# Patient Record
Sex: Male | Born: 1965 | Race: White | Hispanic: No | Smoking: Current every day smoker
Health system: Southern US, Community
[De-identification: ages and names within clinical notes are randomized; demographics above are authoritative.]

## PROBLEM LIST (undated history)

## (undated) ENCOUNTER — Emergency Department (HOSPITAL_COMMUNITY): Discharge status: Absent without leave | Payer: Medicare Other

## (undated) DIAGNOSIS — S069XAA Unspecified intracranial injury with loss of consciousness status unknown, initial encounter: Secondary | ICD-10-CM

## (undated) DIAGNOSIS — Z9889 Other specified postprocedural states: Secondary | ICD-10-CM

## (undated) DIAGNOSIS — I499 Cardiac arrhythmia, unspecified: Secondary | ICD-10-CM

## (undated) DIAGNOSIS — F99 Mental disorder, not otherwise specified: Secondary | ICD-10-CM

## (undated) DIAGNOSIS — S069X9A Unspecified intracranial injury with loss of consciousness of unspecified duration, initial encounter: Secondary | ICD-10-CM

## (undated) DIAGNOSIS — F329 Major depressive disorder, single episode, unspecified: Secondary | ICD-10-CM

## (undated) DIAGNOSIS — F32A Depression, unspecified: Secondary | ICD-10-CM

## (undated) HISTORY — PX: NECK SURGERY: SHX720

## (undated) HISTORY — PX: RADIOFREQUENCY ABLATION: SHX2290

---

## 2012-01-21 ENCOUNTER — Inpatient Hospital Stay (HOSPITAL_COMMUNITY): Payer: Medicare Other

## 2012-01-21 ENCOUNTER — Encounter (HOSPITAL_COMMUNITY): Payer: Self-pay | Admitting: *Deleted

## 2012-01-21 ENCOUNTER — Emergency Department (HOSPITAL_COMMUNITY): Payer: Medicare Other

## 2012-01-21 ENCOUNTER — Inpatient Hospital Stay (HOSPITAL_COMMUNITY)
Admission: EM | Admit: 2012-01-21 | Discharge: 2012-01-23 | DRG: 683 | Disposition: A | Discharge status: Home / Self Care | Payer: Medicare Other | Attending: Internal Medicine | Admitting: Internal Medicine

## 2012-01-21 DIAGNOSIS — M6282 Rhabdomyolysis: Secondary | ICD-10-CM

## 2012-01-21 DIAGNOSIS — N179 Acute kidney failure, unspecified: Principal | ICD-10-CM

## 2012-01-21 DIAGNOSIS — E860 Dehydration: Secondary | ICD-10-CM | POA: Diagnosis present

## 2012-01-21 DIAGNOSIS — Z860100 Personal history of colon polyps, unspecified: Secondary | ICD-10-CM

## 2012-01-21 DIAGNOSIS — Z79899 Other long term (current) drug therapy: Secondary | ICD-10-CM

## 2012-01-21 DIAGNOSIS — M549 Dorsalgia, unspecified: Secondary | ICD-10-CM | POA: Diagnosis present

## 2012-01-21 DIAGNOSIS — N19 Unspecified kidney failure: Secondary | ICD-10-CM

## 2012-01-21 DIAGNOSIS — R51 Headache: Secondary | ICD-10-CM | POA: Diagnosis present

## 2012-01-21 DIAGNOSIS — F319 Bipolar disorder, unspecified: Secondary | ICD-10-CM | POA: Diagnosis present

## 2012-01-21 DIAGNOSIS — M542 Cervicalgia: Secondary | ICD-10-CM

## 2012-01-21 DIAGNOSIS — F17200 Nicotine dependence, unspecified, uncomplicated: Secondary | ICD-10-CM | POA: Diagnosis present

## 2012-01-21 DIAGNOSIS — I959 Hypotension, unspecified: Secondary | ICD-10-CM

## 2012-01-21 DIAGNOSIS — Z9889 Other specified postprocedural states: Secondary | ICD-10-CM

## 2012-01-21 HISTORY — DX: Cardiac arrhythmia, unspecified: I49.9

## 2012-01-21 HISTORY — DX: Depression, unspecified: F32.A

## 2012-01-21 HISTORY — DX: Unspecified intracranial injury with loss of consciousness of unspecified duration, initial encounter: S06.9X9A

## 2012-01-21 HISTORY — DX: Major depressive disorder, single episode, unspecified: F32.9

## 2012-01-21 HISTORY — DX: Mental disorder, not otherwise specified: F99

## 2012-01-21 HISTORY — DX: Unspecified intracranial injury with loss of consciousness status unknown, initial encounter: S06.9XAA

## 2012-01-21 HISTORY — DX: Other specified postprocedural states: Z98.890

## 2012-01-21 LAB — COMPREHENSIVE METABOLIC PANEL
AST: 44 U/L — ABNORMAL HIGH (ref 0–37)
Alkaline Phosphatase: 85 U/L (ref 39–117)
CO2: 26 mEq/L (ref 19–32)
Chloride: 99 mEq/L (ref 96–112)
Creatinine, Ser: 3.07 mg/dL — ABNORMAL HIGH (ref 0.50–1.35)
GFR calc non Af Amer: 23 mL/min — ABNORMAL LOW (ref >90)
Potassium: 2.9 mEq/L — ABNORMAL LOW (ref 3.5–5.1)
Total Bilirubin: 0.3 mg/dL (ref 0.3–1.2)

## 2012-01-21 LAB — POCT I-STAT, CHEM 8
Calcium, Ion: 1.11 mmol/L — ABNORMAL LOW (ref 1.12–1.23)
Chloride: 102 mEq/L (ref 96–112)
Creatinine, Ser: 2.9 mg/dL — ABNORMAL HIGH (ref 0.50–1.35)
Glucose, Bld: 108 mg/dL — ABNORMAL HIGH (ref 70–99)
HCT: 43 % (ref 39.0–52.0)
Hemoglobin: 14.6 g/dL (ref 13.0–17.0)

## 2012-01-21 LAB — CBC WITH DIFFERENTIAL/PLATELET
Basophils Relative: 1 % (ref 0–1)
Eosinophils Absolute: 0.1 10*3/uL (ref 0.0–0.7)
MCH: 29.9 pg (ref 26.0–34.0)
MCHC: 34.3 g/dL (ref 30.0–36.0)
Neutrophils Relative %: 67 % (ref 43–77)
Platelets: 146 10*3/uL — ABNORMAL LOW (ref 150–400)
RDW: 13.5 % (ref 11.5–15.5)

## 2012-01-21 LAB — VALPROIC ACID LEVEL: Valproic Acid Lvl: 42.5 ug/mL — ABNORMAL LOW (ref 50.0–100.0)

## 2012-01-21 LAB — BASIC METABOLIC PANEL
CO2: 29 mEq/L (ref 19–32)
Calcium: 8.6 mg/dL (ref 8.4–10.5)
GFR calc non Af Amer: 48 mL/min — ABNORMAL LOW (ref >90)
Potassium: 3.8 mEq/L (ref 3.5–5.1)
Sodium: 141 mEq/L (ref 135–145)

## 2012-01-21 LAB — RAPID URINE DRUG SCREEN, HOSP PERFORMED
Barbiturates: NOT DETECTED
Cocaine: NOT DETECTED

## 2012-01-21 LAB — URINALYSIS, ROUTINE W REFLEX MICROSCOPIC
Leukocytes, UA: NEGATIVE
Protein, ur: NEGATIVE mg/dL
Urobilinogen, UA: 0.2 mg/dL (ref 0.0–1.0)

## 2012-01-21 LAB — TROPONIN I
Troponin I: <0.3 ng/mL (ref <0.30)
Troponin I: <0.3 ng/mL (ref <0.30)

## 2012-01-21 MED ORDER — DIVALPROEX SODIUM 250 MG PO DR TAB
250.0000 mg | DELAYED_RELEASE_TABLET | Freq: Every day | ORAL | Status: DC
  Administered 2012-01-22: 250 mg via ORAL
  Filled 2012-01-21: qty 1

## 2012-01-21 MED ORDER — DIVALPROEX SODIUM 250 MG PO DR TAB: 250.0000 mg | DELAYED_RELEASE_TABLET | Freq: Three times a day (TID) | ORAL | Status: DC

## 2012-01-21 MED ORDER — ALBUTEROL SULFATE (5 MG/ML) 0.5% IN NEBU: 2.5000 mg | INHALATION_SOLUTION | RESPIRATORY_TRACT | Status: DC | PRN

## 2012-01-21 MED ORDER — CLONAZEPAM 0.5 MG PO TABS
0.5000 mg | ORAL_TABLET | Freq: Three times a day (TID) | ORAL | Status: DC | PRN
  Administered 2012-01-21 – 2012-01-23 (×3): 0.5 mg via ORAL
  Filled 2012-01-21 (×3): qty 1

## 2012-01-21 MED ORDER — FENTANYL CITRATE 0.05 MG/ML IJ SOLN
50.0000 ug | Freq: Once | INTRAMUSCULAR | Status: AC
  Administered 2012-01-21: 50 ug via INTRAVENOUS
  Filled 2012-01-21: qty 2

## 2012-01-21 MED ORDER — ONDANSETRON HCL 4 MG PO TABS
4.0000 mg | ORAL_TABLET | Freq: Four times a day (QID) | ORAL | Status: DC | PRN
  Administered 2012-01-23: 4 mg via ORAL
  Filled 2012-01-21: qty 1

## 2012-01-21 MED ORDER — NICOTINE POLACRILEX 2 MG MT GUM
2.0000 mg | CHEWING_GUM | OROMUCOSAL | Status: DC | PRN
  Administered 2012-01-22 – 2012-01-23 (×2): 2 mg via ORAL
  Filled 2012-01-21 (×3): qty 1

## 2012-01-21 MED ORDER — ACETAMINOPHEN 325 MG PO TABS: 650.0000 mg | ORAL_TABLET | Freq: Four times a day (QID) | ORAL | Status: DC | PRN

## 2012-01-21 MED ORDER — POTASSIUM CHLORIDE CRYS ER 20 MEQ PO TBCR
20.0000 meq | EXTENDED_RELEASE_TABLET | Freq: Once | ORAL | Status: AC
  Administered 2012-01-21: 20 meq via ORAL
  Filled 2012-01-21: qty 1

## 2012-01-21 MED ORDER — ONDANSETRON HCL 4 MG/2ML IJ SOLN
4.0000 mg | Freq: Four times a day (QID) | INTRAMUSCULAR | Status: DC | PRN
  Administered 2012-01-21: 4 mg via INTRAVENOUS
  Filled 2012-01-21: qty 2

## 2012-01-21 MED ORDER — SODIUM CHLORIDE 0.9 % IV SOLN
INTRAVENOUS | Status: DC
  Administered 2012-01-21: 1000 mL via INTRAVENOUS
  Administered 2012-01-22: 10:00:00 via INTRAVENOUS
  Administered 2012-01-22: 125 mL via INTRAVENOUS
  Administered 2012-01-23: 75 mL/h via INTRAVENOUS

## 2012-01-21 MED ORDER — SODIUM CHLORIDE 0.9 % IV BOLUS (SEPSIS)
1000.0000 mL | Freq: Once | INTRAVENOUS | Status: AC
  Administered 2012-01-21: 1000 mL via INTRAVENOUS

## 2012-01-21 MED ORDER — MORPHINE SULFATE 2 MG/ML IJ SOLN
1.0000 mg | INTRAMUSCULAR | Status: DC | PRN
  Administered 2012-01-21 – 2012-01-22 (×5): 1 mg via INTRAVENOUS
  Filled 2012-01-21 (×5): qty 1

## 2012-01-21 MED ORDER — DIVALPROEX SODIUM 250 MG PO DR TAB
250.0000 mg | DELAYED_RELEASE_TABLET | Freq: Every day | ORAL | Status: DC
  Filled 2012-01-21: qty 1

## 2012-01-21 MED ORDER — OXYCODONE HCL 5 MG PO TABS
5.0000 mg | ORAL_TABLET | ORAL | Status: DC | PRN
  Administered 2012-01-21 – 2012-01-22 (×3): 5 mg via ORAL
  Filled 2012-01-21 (×3): qty 1

## 2012-01-21 MED ORDER — DIVALPROEX SODIUM 500 MG PO DR TAB
500.0000 mg | DELAYED_RELEASE_TABLET | Freq: Every day | ORAL | Status: DC
  Administered 2012-01-21 – 2012-01-22 (×2): 500 mg via ORAL
  Filled 2012-01-21 (×3): qty 1

## 2012-01-21 MED ORDER — ACETAMINOPHEN 650 MG RE SUPP: 650.0000 mg | Freq: Four times a day (QID) | RECTAL | Status: DC | PRN

## 2012-01-21 NOTE — H&P (Signed)
Dennis Fields is an 46 y.o. male.    PCP:  He is from out of town  Chief Complaint: Neck pain, back pain, dizzy  HPI: This is a 46 year old, Caucasian male, with a past medical history of cardiac ablation for unclear reasons. He also has a history of mental disorder, possibly bipolar. 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 About 2-3 hours after he started working he started having pain in the back of his head and the neck. The pain radiated down his back. He continued working and went home after working for 8 hours. He also had cramps in his fingers of both hands, as well as in his arms and his feet. This morning when he woke up he went to work, however, felt very dizzy, and he says, that he almost passed out 3 times. At that time, he decided to come in to the hospital for further evaluation. Currently, he has pain in the back of his head and neck, radiating down his lower back. Denies any fever or chills. Denies any sick contacts. He denies any chest pain or shortness of breath. Denies any visual disturbances. Denies any focal weakness. Denies any falls or injuries. Denies any new medications. He has never had these symptoms in the past. However, he has had lower back pain in the past. He, says, that he had been drinking fluids while working, and doesn't think that he got dehydrated.   Home Medications: Prior to Admission medications   Medication Sig Start Date End Date Taking? Authorizing Provider  amphetamine-dextroamphetamine (ADDERALL) 30 MG tablet Take 30 mg by mouth daily.   Yes Historical Provider, MD  aspirin EC 81 MG tablet Take 81 mg by mouth daily.   Yes Historical Provider, MD  clonazePAM (KLONOPIN) 1 MG tablet Take 1 mg by mouth 4 (four) times daily as needed. For anxiety   Yes Historical Provider, MD  divalproex (DEPAKOTE) 250 MG DR tablet Take 250-500 mg by mouth 3 (three) times daily.  Takes 250mg  in the am & 500mg  in at night   Yes Historical Provider, MD  metoprolol tartrate (LOPRESSOR) 25 MG tablet Take 25 mg by mouth 2 (two) times daily.   Yes Historical Provider, MD  QUEtiapine (SEROQUEL) 300 MG tablet Take 300 mg by mouth at bedtime.   Yes Historical Provider, MD  ranitidine (ZANTAC) 150 MG tablet Take 150 mg by mouth 2 (two) times daily.   Yes Historical Provider, MD    Allergies:  Allergies  Allergen Reactions  . Ketorolac Itching and Nausea And Vomiting  . Other     "Teramycin""  . Penicillins Nausea And Vomiting    Past Medical History: Past Medical History  Diagnosis Date  . Arrhythmia   . Mental disorder     Past Surgical History  Procedure Date  . Radiofrequency ablation   . Neck surgery     on salivary glands and lymph nodes for unknown reason    Social History:  reports that he has been smoking Cigarettes.  He has a 42 pack-year smoking history. He does not have any smokeless tobacco history on file. He reports that he does not drink alcohol or use illicit drugs.  Family History: He was not very specific about his family medical history, but did admit to heart disease, lung disease, and diabetes in the family.  Review of Systems - History obtained from  the patient General ROS: positive for  - fatigue Psychological ROS: negative Ophthalmic ROS: negative ENT ROS: negative Allergy and Immunology ROS: negative Hematological and Lymphatic ROS: negative Endocrine ROS: negative Respiratory ROS: no cough, shortness of breath, or wheezing Cardiovascular ROS: no chest pain or dyspnea on exertion Gastrointestinal ROS: no abdominal pain, change in bowel habits, or black or bloody stools Genito-Urinary ROS: no dysuria, trouble voiding, or hematuria Musculoskeletal ROS: as in hpi Neurological ROS: as in hpi Dermatological ROS: negative  Physical Examination Blood pressure 98/67, pulse 78, temperature 98.6 F (37 C), temperature source Oral, resp.  rate 20, SpO2 97.00%.  General appearance: alert, cooperative, appears stated age, no distress and moderately obese Head: Normocephalic, without obvious abnormality, atraumatic Eyes: conjunctivae/corneas clear. PERRL, EOM's intact.  Throat: lips, mucosa, and tongue normal; teeth and gums normal Neck: no adenopathy, no carotid bruit, no JVD, trachea midline and thyroid not enlarged, symmetric. Did have limited ROM. There was pain with movement of neck.  Back: symmetric, no curvature. ROM normal. No CVA tenderness. No tenderness over spinous processes Resp: clear to auscultation bilaterally Cardio: regular rate and rhythm, S1, S2 normal, no murmur, click, rub or gallop GI: soft, non-tender; bowel sounds normal; no masses,  no organomegaly Extremities: extremities normal, atraumatic, no cyanosis or edema. SLR was positive bilaterally. But patient had good strength Pulses: 2+ and symmetric Skin: Skin color, texture, turgor normal. No rashes or lesions Lymph nodes: Cervical, supraclavicular, and axillary nodes normal. Neurologic: He is alert and oriented x3. Cranial nerves intact. Motor strength is equal bilaterally. Reflexes are equal, bilaterally. No sensory deficits. Gait was not assessed at this time.  Laboratory Data: Results for orders placed during the hospital encounter of 01/21/12 (from the past 48 hour(s))  CBC WITH DIFFERENTIAL     Status: Abnormal   Collection Time   01/21/12 12:48 PM      Component Value Range Comment   WBC 6.9  4.0 - 10.5 K/uL    RBC 4.79  4.22 - 5.81 MIL/uL    Hemoglobin 14.3  13.0 - 17.0 g/dL    HCT 16.1  09.6 - 04.5 %    MCV 87.1  78.0 - 100.0 fL    MCH 29.9  26.0 - 34.0 pg    MCHC 34.3  30.0 - 36.0 g/dL    RDW 40.9  81.1 - 91.4 %    Platelets 146 (*) 150 - 400 K/uL    Neutrophils Relative 67  43 - 77 %    Neutro Abs 4.6  1.7 - 7.7 K/uL    Lymphocytes Relative 20  12 - 46 %    Lymphs Abs 1.4  0.7 - 4.0 K/uL    Monocytes Relative 10  3 - 12 %     Monocytes Absolute 0.7  0.1 - 1.0 K/uL    Eosinophils Relative 2  0 - 5 %    Eosinophils Absolute 0.1  0.0 - 0.7 K/uL    Basophils Relative 1  0 - 1 %    Basophils Absolute 0.0  0.0 - 0.1 K/uL   TROPONIN I     Status: Normal   Collection Time   01/21/12 12:48 PM      Component Value Range Comment   Troponin I <0.30  <0.30 ng/mL   LACTIC ACID, PLASMA     Status: Abnormal   Collection Time   01/21/12 12:49 PM      Component Value Range Comment   Lactic Acid, Venous 2.4 (*) 0.5 -  2.2 mmol/L   POCT I-STAT, CHEM 8     Status: Abnormal   Collection Time   01/21/12  1:02 PM      Component Value Range Comment   Sodium 143  135 - 145 mEq/L    Potassium 3.2 (*) 3.5 - 5.1 mEq/L    Chloride 102  96 - 112 mEq/L    BUN 33 (*) 6 - 23 mg/dL    Creatinine, Ser 1.61 (*) 0.50 - 1.35 mg/dL    Glucose, Bld 096 (*) 70 - 99 mg/dL    Calcium, Ion 0.45 (*) 1.12 - 1.23 mmol/L    TCO2 25  0 - 100 mmol/L    Hemoglobin 14.6  13.0 - 17.0 g/dL    HCT 40.9  81.1 - 91.4 %   CK TOTAL AND CKMB     Status: Abnormal   Collection Time   01/21/12  1:29 PM      Component Value Range Comment   Total CK 1158 (*) 7 - 232 U/L    CK, MB 32.9 (*) 0.3 - 4.0 ng/mL    Relative Index 2.8 (*) 0.0 - 2.5     Radiology Reports: Dg Chest Port 1 View  01/21/2012  *RADIOLOGY REPORT*  Clinical Data: Dizziness and chest pressure.  Prior cardiac catheterization.  PORTABLE CHEST - 1 VIEW  Comparison: None.  Findings: Mild apical lordotic patient positioning. Midline trachea.  Normal heart size and mediastinal contours. No pleural effusion or pneumothorax.  No congestive failure.  Clear lungs.  IMPRESSION: No acute cardiopulmonary disease.   Original Report Authenticated By: Consuello Bossier, M.D.     Electrocardiogram: Normal sinus rhythm at 88 beats per minute. Normal axis. Intervals appear to be in the normal range. No significant Q waves. No concerning ST or T-wave changes noted.  Assessment/Plan  Principal Problem:  *ARF (acute  renal failure) Active Problems:  Neck pain  Back pain  Hypotension  History of prior ablation treatment  History of colon polyps   #1 acute renal failure: This is most likely prerenal azotemia secondary to dehydration. We do not have any baseline labs on this patient. He'll be given IV fluids. His renal function will be monitored closely. If his renal function does not improve, renal ultrasound will be considered. A UA will be checked. Calcium level will be checked.  #2 hypotension: Possibly from hypovolemia. His blood pressure did improve with IV fluids. We'll continue to provide him normal saline. We will monitor him closely tonight in the step down unit.  #3 neck pain, headache, and back pain: Etiology remains unclear. There are no neurological deficits. There is no history of injury. However, we will proceed with a CT head and neck at this time. He denies any chest pain, and his EKG was nonacute. I believe his muscular pain may be due to mild rhabdomyolysis as his CK levels are elevated. We will also get the x-ray of his lumbar spine.  #4 rhabdomyolysis: We will trend CK levels of daily. We'll give him IV fluids.  #5 tobacco abuse: Counseling was provided. He requested Nicotine gum, which will be provided to him.  #6 history of mental disorder: We will check a Depakote level. Will hold the Seroquel for tonight due to hypotension. Hold his Adderall as well.  DVT, prophylaxis with SCDs.  He is a full code.  Further management decisions will depend on results of further testing and patient's response to treatment  [facility]  [facility] Pager [phone]  01/21/2012,  3:51 PM

## 2012-01-21 NOTE — ED Notes (Signed)
EDP aware of CKMB

## 2012-01-21 NOTE — ED Notes (Signed)
Pt knows that urine is needed 

## 2012-01-21 NOTE — ED Notes (Signed)
Pt reports started having cervical spine pain and tenderness while working. States when he got home last night he started having cramping to his arms and legs. Stated went to work this am and had several dizzy spells. Denies chest pain or sob. On exam pt moves all extremities well but states that legs feel heavy and hurt to move. Pt reports pain when he moves his head side to side right side worse then left. Reports tenderness with palpation down entire cspine, denies any injury yesterday while at work. CMS intact. ROM intact.

## 2012-01-21 NOTE — ED Notes (Signed)
PT states that he started getting neck pain yesterday while working and patient states pain from mid back down.  Pt reports cramps in hands, fingers and feet.  PT reports dizziness and blood pressure is in the 70s systolic at triage.  Pt has seen cardiologist for ablations

## 2012-01-21 NOTE — ED Provider Notes (Signed)
History     CSN: [id number]  Arrival date & time 01/21/12  1200   First MD Initiated Contact with Patient 01/21/12 1223      Chief Complaint  Patient presents with  . Neck Pain  . Dizziness    (Consider location/radiation/quality/duration/timing/severity/associated sxs/prior treatment) Patient is a 46 y.o. male presenting with neck pain. The history is provided by the patient.  Neck Pain  This is a new problem. Pertinent negatives include no chest pain, no numbness, no headaches and no weakness.   patient states that he started getting neck pain while working yesterday. 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 He states the pain has now moved down his back. He states he has had cramping in his hands fingers and feet. He's had lightheadedness and felt like he couldn't pass out. Initial blood pressure was in the 70s. He is recently seen a cardiologist for ablations. He is on metoprolol. No fevers. No trauma. No abdominal pain. No previous episodes like this.  Past Medical History  Diagnosis Date  . Arrhythmia     Past Surgical History  Procedure Date  . Radiofrequency ablation   . Cervical fusion     No family history on file.  History  Substance Use Topics  . Smoking status: Current Everyday Smoker  . Smokeless tobacco: Not on file  . Alcohol Use: No      Review of Systems  Constitutional: Negative for activity change and appetite change.  HENT: Positive for neck pain. Negative for neck stiffness.   Eyes: Negative for pain.  Respiratory: Negative for chest tightness and shortness of breath.   Cardiovascular: Negative for chest pain and leg swelling.  Gastrointestinal: Negative for nausea, vomiting, abdominal pain and diarrhea.  Genitourinary: Negative for flank pain.  Musculoskeletal: Negative for back pain.  Skin: Negative for rash.  Neurological: Positive for light-headedness. Negative for weakness, numbness and headaches.  Psychiatric/Behavioral: Negative for behavioral  problems.    Allergies  Ketorolac; Other; and Penicillins  Home Medications   Current Outpatient Rx  Name Route Sig Dispense Refill  . AMPHETAMINE-DEXTROAMPHETAMINE 30 MG PO TABS Oral Take 30 mg by mouth daily.    . ASPIRIN EC 81 MG PO TBEC Oral Take 81 mg by mouth daily.    Marland Kitchen CLONAZEPAM 1 MG PO TABS Oral Take 1 mg by mouth 4 (four) times daily as needed. For anxiety    . DIVALPROEX SODIUM 250 MG PO TBEC Oral Take 250-500 mg by mouth 3 (three) times daily. Takes 250mg  in the am & 500mg  in at night    . METOPROLOL TARTRATE 25 MG PO TABS Oral Take 25 mg by mouth 2 (two) times daily.    . QUETIAPINE FUMARATE 300 MG PO TABS Oral Take 300 mg by mouth at bedtime.    Marland Kitchen RANITIDINE HCL 150 MG PO TABS Oral Take 150 mg by mouth 2 (two) times daily.      BP 98/67  Pulse 78  Temp 98.6 F (37 C) (Oral)  Resp 20  SpO2 97%  Physical Exam  Nursing note and vitals reviewed. Constitutional: He is oriented to person, place, and time. He appears well-developed and well-nourished.  HENT:  Head: Normocephalic and atraumatic.  Eyes: EOM are normal. Pupils are equal, round, and reactive to light.  Neck: Normal range of motion. Neck supple.       Tenderness to bilateral paraspinal area.  Cardiovascular: Normal rate, regular rhythm and normal heart sounds.   No murmur heard. Pulmonary/Chest: Effort normal and  breath sounds normal.  Abdominal: Soft. Bowel sounds are normal. He exhibits no distension and no mass. There is no tenderness. There is no rebound and no guarding.  Musculoskeletal: Normal range of motion. He exhibits no edema.  Neurological: He is alert and oriented to person, place, and time. No cranial nerve deficit.  Skin: Skin is warm and dry.  Psychiatric: He has a normal mood and affect.    ED Course  Procedures (including critical care time)  Labs Reviewed  CBC WITH DIFFERENTIAL - Abnormal; Notable for the following:    Platelets 146 (*)     All other components within normal  limits  LACTIC ACID, PLASMA - Abnormal; Notable for the following:    Lactic Acid, Venous 2.4 (*)     All other components within normal limits  POCT I-STAT, CHEM 8 - Abnormal; Notable for the following:    Potassium 3.2 (*)     BUN 33 (*)     Creatinine, Ser 2.90 (*)     Glucose, Bld 108 (*)     Calcium, Ion 1.11 (*)     All other components within normal limits  CK TOTAL AND CKMB - Abnormal; Notable for the following:    Total CK 1158 (*)     CK, MB 32.9 (*)     Relative Index 2.8 (*)     All other components within normal limits  TROPONIN I  URINALYSIS, ROUTINE W REFLEX MICROSCOPIC   Dg Chest Port 1 View  01/21/2012  *RADIOLOGY REPORT*  Clinical Data: Dizziness and chest pressure.  Prior cardiac catheterization.  PORTABLE CHEST - 1 VIEW  Comparison: None.  Findings: Mild apical lordotic patient positioning. Midline trachea.  Normal heart size and mediastinal contours. No pleural effusion or pneumothorax.  No congestive failure.  Clear lungs.  IMPRESSION: No acute cardiopulmonary disease.   Original Report Authenticated By: Consuello Bossier, M.D.      No diagnosis found.   Date: 01/21/2012  Rate:  Date: 01/21/2012  Rate: 88  Rhythm: normal sinus rhythm  QRS Axis: normal  Intervals: normal  ST/T Wave abnormalities: normal  Conduction Disutrbances:none  Narrative Interpretation:   Old EKG Reviewed: none available   CRITICAL CARE Performed by: Billee Cashing   Total critical care time: 30 Critical care time was exclusive of separately billable procedures and treating other patients.  Critical care was necessary to treat or prevent imminent or life-threatening deterioration.  Critical care was time spent personally by me on the following activities: development of treatment plan with patient and/or surrogate as well as nursing, discussions with consultants, evaluation of patient's response to treatment, examination of patient, obtaining history from patient or  surrogate, ordering and performing treatments and interventions, ordering and review of laboratory studies, ordering and review of radiographic studies, pulse oximetry and re-evaluation of patient's condition.   MDM  Patient with you neck pain and dizziness. Hypertension is improved somewhat with fluids. Lab work shows a new renal failure. Total CK is mildly elevated. He is mildly elevated lactate. EKG is reassuring. X-ray chest showed no acute cardiopulmonary disease. CT chest with done to evaluate for possibility of dissection. CT angiogram done due 2 renal failure. Patient be admitted to medicine.        Juliet Rude. Rubin Payor, MD 01/21/12 1550

## 2012-01-21 NOTE — ED Notes (Signed)
Admitting paged regarding Potassium

## 2012-01-21 NOTE — ED Notes (Signed)
IV attempted x 2, IV infiltrating with saline will have another nurse attempt

## 2012-01-22 LAB — COMPREHENSIVE METABOLIC PANEL
ALT: 39 U/L (ref 0–53)
AST: 35 U/L (ref 0–37)
Albumin: 3.2 g/dL — ABNORMAL LOW (ref 3.5–5.2)
Alkaline Phosphatase: 74 U/L (ref 39–117)
Calcium: 8.3 mg/dL — ABNORMAL LOW (ref 8.4–10.5)
Glucose, Bld: 116 mg/dL — ABNORMAL HIGH (ref 70–99)
Potassium: 3.9 mEq/L (ref 3.5–5.1)
Sodium: 140 mEq/L (ref 135–145)
Total Protein: 6.1 g/dL (ref 6.0–8.3)

## 2012-01-22 LAB — CBC
HCT: 38.3 % — ABNORMAL LOW (ref 39.0–52.0)
Hemoglobin: 12.7 g/dL — ABNORMAL LOW (ref 13.0–17.0)
MCH: 29.3 pg (ref 26.0–34.0)
MCHC: 33.2 g/dL (ref 30.0–36.0)
MCV: 88.5 fL (ref 78.0–100.0)
RDW: 13.4 % (ref 11.5–15.5)

## 2012-01-22 LAB — PROTIME-INR: INR: 0.97 (ref 0.00–1.49)

## 2012-01-22 LAB — TROPONIN I
Troponin I: <0.3 ng/mL (ref <0.30)
Troponin I: <0.3 ng/mL (ref <0.30)

## 2012-01-22 LAB — TSH: TSH: 0.666 u[IU]/mL (ref 0.350–4.500)

## 2012-01-22 LAB — CK TOTAL AND CKMB (NOT AT ARMC): Relative Index: 2.5 (ref 0.0–2.5)

## 2012-01-22 LAB — CORTISOL-AM, BLOOD: Cortisol - AM: 2.4 ug/dL — ABNORMAL LOW (ref 4.3–22.4)

## 2012-01-22 MED ORDER — COSYNTROPIN 0.25 MG IJ SOLR
0.2500 mg | Freq: Once | INTRAMUSCULAR | Status: AC
  Administered 2012-01-22: 0.25 mg via INTRAVENOUS
  Filled 2012-01-22: qty 0.25

## 2012-01-22 MED ORDER — ENOXAPARIN SODIUM 40 MG/0.4ML ~~LOC~~ SOLN
40.0000 mg | SUBCUTANEOUS | Status: DC
  Administered 2012-01-22: 40 mg via SUBCUTANEOUS
  Filled 2012-01-22 (×2): qty 0.4

## 2012-01-22 MED ORDER — FENTANYL CITRATE 0.05 MG/ML IJ SOLN
25.0000 ug | INTRAMUSCULAR | Status: DC | PRN
  Administered 2012-01-22: 50 ug via INTRAVENOUS
  Administered 2012-01-22: 25 ug via INTRAVENOUS
  Administered 2012-01-22 – 2012-01-23 (×5): 50 ug via INTRAVENOUS
  Filled 2012-01-22 (×6): qty 2

## 2012-01-22 MED ORDER — QUETIAPINE FUMARATE 300 MG PO TABS
300.0000 mg | ORAL_TABLET | Freq: Every evening | ORAL | Status: DC | PRN
  Filled 2012-01-22: qty 1

## 2012-01-22 MED ORDER — SENNOSIDES-DOCUSATE SODIUM 8.6-50 MG PO TABS
1.0000 | ORAL_TABLET | Freq: Every day | ORAL | Status: DC
  Administered 2012-01-22: 1 via ORAL
  Filled 2012-01-22: qty 1

## 2012-01-22 MED ORDER — DIVALPROEX SODIUM 500 MG PO DR TAB
500.0000 mg | DELAYED_RELEASE_TABLET | Freq: Every day | ORAL | Status: DC
  Filled 2012-01-22: qty 1

## 2012-01-22 MED ORDER — DIVALPROEX SODIUM 250 MG PO DR TAB
250.0000 mg | DELAYED_RELEASE_TABLET | Freq: Every day | ORAL | Status: DC
  Administered 2012-01-23: 250 mg via ORAL
  Filled 2012-01-22 (×2): qty 1

## 2012-01-22 MED ORDER — MORPHINE SULFATE 2 MG/ML IJ SOLN: 2.0000 mg | INTRAMUSCULAR | Status: DC | PRN

## 2012-01-22 MED ORDER — COSYNTROPIN 0.25 MG IJ SOLR
0.2500 mg | Freq: Once | INTRAMUSCULAR | Status: AC
  Administered 2012-01-23: 0.25 mg via INTRAVENOUS
  Filled 2012-01-22 (×2): qty 0.25

## 2012-01-22 MED ORDER — OXYCODONE HCL 5 MG PO TABS
5.0000 mg | ORAL_TABLET | Freq: Four times a day (QID) | ORAL | Status: DC
  Administered 2012-01-22 – 2012-01-23 (×4): 5 mg via ORAL
  Filled 2012-01-22 (×4): qty 1

## 2012-01-22 NOTE — Progress Notes (Signed)
[facility] PROGRESS NOTE  Dennis Fields ZOX:[id number] DOB: 04-Jun-1965 DOA: 01/21/2012 PCP: Pcp Not In System  Assessment/Plan: Principal Problem:  *ARF Likely prerenal - now improved with hydration.   Active Problems:  Neck pain/Back pain Imaging is negative- will treat as musculoskeletal pain - due to recent decline renal function, will hold off on starting NSAIDS for today- attempt pain control with Percocet Q6hr for now with PRN Fentanyl for breakthrough.    Hypotension Improved with hydration. Orthostatics obtained this AM are negative.    Rhabdomyolysis Improving with hydration, will cont IVF but decrease rate for now to 100 cc/hr.    History of prior ablation treatment   History of colon polyps    Code Status: Full Code Family Communication: pt alert and plan discussed- no family at bedside Disposition Plan: transfer to med/surg DVT prophylaxis: lovenox   Brief narrative: This is a 46 year old, Caucasian male, with a past medical history of cardiac ablation for unclear reasons. He also has a history of mental disorder, possibly bipolar. 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 About 2-3 hours after he started working he started having pain in the back of his head and the neck. The pain radiated down his back. He continued working for the next 8 hrs. He began to note cramps in his fingers of both hands, arms and his feet. This morning when he woke up he went to work, however, felt very dizzy, and he says, that he almost passed out 3 times. At that time, he decided to come in to the hospital for further evaluation. Currently, he has pain in the back of his head and neck, radiating down his lower back. Denies any fever or chills. Denies any sick contacts. He denies any chest pain or shortness of breath. Denies any visual disturbances. Denies any focal weakness. Denies any falls or  injuries. Denies any new medications. He has never had these symptoms in the past. However, he has had lower back pain in the past. He, says, that he had been drinking fluids while working, and doesn't think that he has become dehydrated.   Consultants:  none  Procedures:  none  Antibiotics:  none  HPI/Subjective: Pt c/o pain in back of neck and down the back which is currently severe and not controlled with Morphine- requesting Fentanyl. No other complaints. No longer having any cramping. No feeling of being lightheaded upon ambulation.   Objective: Filed Vitals:   01/22/12 1105 01/22/12 1115 01/22/12 1118 01/22/12 1121  BP: 117/62 97/61 98/69  103/57  Pulse: 85 73 81 81  Temp: 97.9 F (36.6 C)     TempSrc: Oral     Resp: 16 11 13 13   Height:      Weight:      SpO2: 91% 93% 90% 93%    Intake/Output Summary (Last 24 hours) at 01/22/12 1443 Last data filed at 01/22/12 1200  Gross per 24 hour  Intake 2990.83 ml  Output    700 ml  Net 2290.83 ml    Exam:   General:  No acute distress  Cardiovascular: RRR, nop murmurs  Respiratory: CTA b/l  Abdomen: soft, NT , ND, BS+  Ext:no c/c/e  Musculoskeletal: tender in base of neck bilaterally- tender in paraspinal areas through- out entire back.   Data Reviewed: Basic Metabolic Panel:  Lab 01/22/12 8119 01/21/12 2130 01/21/12 1536 01/21/12 1302  NA  140 141 140 143  K 3.9 3.8 2.9* 3.2*  CL 104 104 99 102  CO2 29 29 26  --  GLUCOSE 116* 122* 92 108*  BUN 19 24* 29* 33*  CREATININE 1.26 1.68* 3.07* 2.90*  CALCIUM 8.3* 8.6 9.5 --  MG -- -- -- --  PHOS -- -- -- --   Liver Function Tests:  Lab 01/22/12 0530 01/21/12 1536  AST 35 44*  ALT 39 47  ALKPHOS 74 85  BILITOT 0.2* 0.3  PROT 6.1 6.8  ALBUMIN 3.2* 3.6   No results found for this basename: LIPASE:5,AMYLASE:5 in the last 168 hours No results found for this basename: AMMONIA:5 in the last 168 hours CBC:  Lab 01/22/12 0530 01/21/12 1302 01/21/12 1248    WBC 4.8 -- 6.9  NEUTROABS -- -- 4.6  HGB 12.7* 14.6 14.3  HCT 38.3* 43.0 41.7  MCV 88.5 -- 87.1  PLT 131* -- 146*   Cardiac Enzymes:  Lab 01/22/12 0530 01/21/12 2324 01/21/12 1750 01/21/12 1329 01/21/12 1248  CKTOTAL 629* -- -- 1158* --  CKMB 15.9* -- -- 32.9* --  CKMBINDEX -- -- -- -- --  TROPONINI <0.30 <0.30 <0.30 -- <0.30   BNP (last 3 results) No results found for this basename: PROBNP:3 in the last 8760 hours CBG: No results found for this basename: GLUCAP:5 in the last 168 hours  No results found for this or any previous visit (from the past 240 hour(s)).   Studies: Dg Lumbar Spine 2-3 Views  01/21/2012  *RADIOLOGY REPORT*  Clinical Data: Low back pain.  LUMBAR SPINE - 2-3 VIEW  Comparison: None.  Findings: There are six non-rib-bearing lumbar vertebrae. Vertebral body alignment and heights are normal.  There is mild spondylosis present.  There is mild disc space narrowing at the L2- 3, L3-4 and L4-5 levels.  There is no compression fracture or subluxation.  There is mild facet arthropathy of the lower lumbar spine.  IMPRESSION: Mild spondylosis with degenerative disc disease as described.   Original Report Authenticated By: Elba Barman, M.D.    Ct Head Wo Contrast  01/21/2012  *RADIOLOGY REPORT*  Clinical Data:  Neck pain, headache, dizziness, syncopal episode  CT HEAD WITHOUT CONTRAST CT CERVICAL SPINE WITHOUT CONTRAST  Technique:  Multidetector CT imaging of the head and cervical spine was performed following the standard protocol without intravenous contrast.  Multiplanar CT image reconstructions of the cervical spine were also generated.  Comparison:  None  CT HEAD  Findings: Normal ventricular morphology. No midline shift or mass effect. Normal appearance of brain parenchyma. No intracranial hemorrhage, mass lesion, or acute infarction. Visualized paranasal sinuses and mastoid air cells clear. Bones unremarkable.  IMPRESSION: No acute intracranial abnormalities.  CT  CERVICAL SPINE  Findings: Disc space narrowing with endplate spur formation C5-C6, C6-C7. Visualized skull base intact. Vertebral body and disc space heights otherwise maintained without fracture or subluxation. Minimal scattered facet degenerative changes. Prevertebral soft tissues normal thickness. Tips of lung apices clear.  IMPRESSION: Degenerative disc disease changes C5-C6 and C6-C7. No acute cervical spine abnormalities.   Original Report Authenticated By: Lollie Marrow, M.D.    Ct Chest Wo Contrast  01/21/2012  *RADIOLOGY REPORT*  Clinical Data: Neck pain, dizziness, syncope, tingling in hands and feet  CT CHEST WITHOUT CONTRAST  Technique:  Multidetector CT imaging of the chest was performed following the standard protocol without IV contrast. Sagittal and coronal MPR images reconstructed from axial data set.  Comparison: Chest radiograph 01/21/2012  Findings:  Aorta normal caliber. No enlarged thoracic lymph nodes. Visualized portion of upper abdomen unremarkable. Beam hardening artifacts at the upper chest secondary to the patient's shoulders. Dependent atelectasis in posterior aspects of both lungs predominately lower lobes. No definite pulmonary infiltrate, pleural effusion, or pneumothorax. No pulmonary mass identified. Question minimal atelectasis or scarring at lingula and medial right middle lobe. No acute osseous findings.  IMPRESSION: Dependent atelectasis. No additional intrathoracic abnormalities.   Original Report Authenticated By: Lollie Marrow, M.D.    Ct Cervical Spine Wo Contrast  01/21/2012  *RADIOLOGY REPORT*  Clinical Data:  Neck pain, headache, dizziness, syncopal episode  CT HEAD WITHOUT CONTRAST CT CERVICAL SPINE WITHOUT CONTRAST  Technique:  Multidetector CT imaging of the head and cervical spine was performed following the standard protocol without intravenous contrast.  Multiplanar CT image reconstructions of the cervical spine were also generated.  Comparison:  None  CT HEAD   Findings: Normal ventricular morphology. No midline shift or mass effect. Normal appearance of brain parenchyma. No intracranial hemorrhage, mass lesion, or acute infarction. Visualized paranasal sinuses and mastoid air cells clear. Bones unremarkable.  IMPRESSION: No acute intracranial abnormalities.  CT CERVICAL SPINE  Findings: Disc space narrowing with endplate spur formation C5-C6, C6-C7. Visualized skull base intact. Vertebral body and disc space heights otherwise maintained without fracture or subluxation. Minimal scattered facet degenerative changes. Prevertebral soft tissues normal thickness. Tips of lung apices clear.  IMPRESSION: Degenerative disc disease changes C5-C6 and C6-C7. No acute cervical spine abnormalities.   Original Report Authenticated By: Lollie Marrow, M.D.    Dg Chest Port 1 View  01/21/2012  *RADIOLOGY REPORT*  Clinical Data: Dizziness and chest pressure.  Prior cardiac catheterization.  PORTABLE CHEST - 1 VIEW  Comparison: None.  Findings: Mild apical lordotic patient positioning. Midline trachea.  Normal heart size and mediastinal contours. No pleural effusion or pneumothorax.  No congestive failure.  Clear lungs.  IMPRESSION: No acute cardiopulmonary disease.   Original Report Authenticated By: Consuello Bossier, M.D.     Scheduled Meds:   . cosyntropin  0.25 mg Intravenous Once  . divalproex  250 mg Oral Daily  . divalproex  500 mg Oral QHS  . divalproex  500 mg Oral QHS  . fentaNYL  50 mcg Intravenous Once  . oxyCODONE  5 mg Oral Q6H  . potassium chloride  20 mEq Oral Once  . senna-docusate  1 tablet Oral QHS  . sodium chloride  1,000 mL Intravenous Once  . DISCONTD: divalproex  250 mg Oral Daily  . DISCONTD: divalproex  250 mg Oral Daily  . DISCONTD: divalproex  250-500 mg Oral TID   Continuous Infusions:   . sodium chloride 125 mL/hr at 01/22/12 4782    ________________________________________________________________________  Time spent: 30  min    [facility]  [facility] Pager [phone] If 8PM-8AM, please contact night-coverage at [email or web address], password [facility] 01/22/2012, 2:43 PM  LOS: 1 day

## 2012-01-23 DIAGNOSIS — M549 Dorsalgia, unspecified: Secondary | ICD-10-CM

## 2012-01-23 LAB — CBC
HCT: 38 % — ABNORMAL LOW (ref 39.0–52.0)
Hemoglobin: 12.5 g/dL — ABNORMAL LOW (ref 13.0–17.0)
MCHC: 32.9 g/dL (ref 30.0–36.0)
WBC: 4.9 10*3/uL (ref 4.0–10.5)

## 2012-01-23 LAB — CK TOTAL AND CKMB (NOT AT ARMC)
CK, MB: 5.8 ng/mL — ABNORMAL HIGH (ref 0.3–4.0)
Relative Index: 1.7 (ref 0.0–2.5)

## 2012-01-23 MED ORDER — HYDROCODONE-ACETAMINOPHEN 10-325 MG PO TABS: 1.0000 | ORAL_TABLET | Freq: Four times a day (QID) | ORAL | Status: AC | PRN

## 2012-01-23 MED ORDER — AMPHETAMINE-DEXTROAMPHETAMINE 10 MG PO TABS: 30.0000 mg | ORAL_TABLET | Freq: Every day | ORAL | Status: DC

## 2012-01-23 NOTE — Progress Notes (Signed)
@  0015 Transferred pt uneventfully to 5N14.

## 2012-01-23 NOTE — Discharge Summary (Addendum)
Physician Discharge Summary  Patient ID: Dennis Fields MRN: [id number] DOB/AGE: 11-10-1965 46 y.o.  Admit date: 01/21/2012 Discharge date: 01/23/2012  PCP: Dr. Lynnae Prude in [place]  DISCHARGE DIAGNOSES:  Principal Problem:  *ARF (acute renal failure) Active Problems:  Neck pain  Back pain  Hypotension  History of prior ablation treatment  History of colon polyps  Rhabdomyolysis   RECOMMENDATIONS TO PCP: 1. Follow up on results of Cosyntropin Stimulation Test  DISCHARGE CONDITION: fair  INITIAL HISTORY: This is a 46 year old, Caucasian male, with a past medical history of cardiac ablation for unclear reasons. He also has a history of mental disorder, possibly bipolar. 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 About 2-3 hours after he started working he started having pain in the back of his head and the neck. The pain radiated down his back. He continued working and went home after working for 8 hours. He also had cramps in his fingers of both hands, as well as in his arms and his feet. The morning of admission when he woke up he went to work, however, felt very dizzy, and he says, that he almost passed out 3 times. At that time, he decided to come in to the hospital for further evaluation. He had pain in the back of his head and neck, radiating down his lower back. Denied any fever or chills. Denied any sick contacts. He denied any chest pain or shortness of breath. Denied any visual disturbances. Denied any focal weakness. Denied any falls or injuries. Denied any new medications. He has never had these symptoms in the past. However, he has had lower back pain in the past. He, says, that he had been drinking fluids while working, and doesn't think that he got dehydrated.   HOSPITAL COURSE:   #1 Acute renal failure: This was most likely prerenal azotemia secondary to  dehydration. Rhabdomyolysis may also have contributed. His BUN was 33 and creatinine was 2.9 at admission. He was given IV fluids and his renals function is now normal. We did not have any baseline labs on this patient.   #2 Hypotension: Possibly from hypovolemia. His blood pressure did improve with IV fluids. A cortisol level was checked which was low. A cosyntropin stimulation test was done this morning and the results are still pending. However his BP has improved significantly. He can go home and PCP can follow up on the results.   #3 Neck pain, headache, and back pain: Etiology is likely Rhabdomyolysis. He continued to get better. His pain is much better. He has been ambulating in the room with no difficulties. There are no neurological deficits. Ct head, neck and lumbar films were non acute.  #4 Rhabdomyolysis: Likely from working outside. CK was initially high at 1158 and then it started decreasing.  #5 Tobacco abuse: Counseling was provided.  #6 history of mental disorder/possibly Bipolar: Depakote level was ok. He may resume home medications.   Overall patient has improved. He is stable for discharge. His parents were in the room and all their questions were answered with patient's permission.   IMAGING STUDIES Dg Lumbar Spine 2-3 Views  01/21/2012  *RADIOLOGY REPORT*  Clinical Data: Low back pain.  LUMBAR SPINE - 2-3 VIEW  Comparison: None.  Findings: There are six non-rib-bearing lumbar vertebrae. Vertebral body alignment and heights are normal.  There is mild spondylosis present.  There is mild disc space narrowing at the L2- 3, L3-4 and L4-5 levels.  There is no compression fracture or subluxation.  There is mild facet arthropathy of the lower lumbar spine.  IMPRESSION: Mild spondylosis with degenerative disc disease as described.   Original Report Authenticated By: Elba Barman, M.D.    Ct Head Wo Contrast  01/21/2012  *RADIOLOGY REPORT*  Clinical Data:  Neck pain, headache,  dizziness, syncopal episode  CT HEAD WITHOUT CONTRAST CT CERVICAL SPINE WITHOUT CONTRAST  Technique:  Multidetector CT imaging of the head and cervical spine was performed following the standard protocol without intravenous contrast.  Multiplanar CT image reconstructions of the cervical spine were also generated.  Comparison:  None  CT HEAD  Findings: Normal ventricular morphology. No midline shift or mass effect. Normal appearance of brain parenchyma. No intracranial hemorrhage, mass lesion, or acute infarction. Visualized paranasal sinuses and mastoid air cells clear. Bones unremarkable.  IMPRESSION: No acute intracranial abnormalities.  CT CERVICAL SPINE  Findings: Disc space narrowing with endplate spur formation C5-C6, C6-C7. Visualized skull base intact. Vertebral body and disc space heights otherwise maintained without fracture or subluxation. Minimal scattered facet degenerative changes. Prevertebral soft tissues normal thickness. Tips of lung apices clear.  IMPRESSION: Degenerative disc disease changes C5-C6 and C6-C7. No acute cervical spine abnormalities.   Original Report Authenticated By: Lollie Marrow, M.D.    Ct Chest Wo Contrast  01/21/2012  *RADIOLOGY REPORT*  Clinical Data: Neck pain, dizziness, syncope, tingling in hands and feet  CT CHEST WITHOUT CONTRAST  Technique:  Multidetector CT imaging of the chest was performed following the standard protocol without IV contrast. Sagittal and coronal MPR images reconstructed from axial data set.  Comparison: Chest radiograph 01/21/2012  Findings: Aorta normal caliber. No enlarged thoracic lymph nodes. Visualized portion of upper abdomen unremarkable. Beam hardening artifacts at the upper chest secondary to the patient's shoulders. Dependent atelectasis in posterior aspects of both lungs predominately lower lobes. No definite pulmonary infiltrate, pleural effusion, or pneumothorax. No pulmonary mass identified. Question minimal atelectasis or scarring  at lingula and medial right middle lobe. No acute osseous findings.  IMPRESSION: Dependent atelectasis. No additional intrathoracic abnormalities.   Original Report Authenticated By: Lollie Marrow, M.D.    Ct Cervical Spine Wo Contrast  01/21/2012  *RADIOLOGY REPORT*  Clinical Data:  Neck pain, headache, dizziness, syncopal episode  CT HEAD WITHOUT CONTRAST CT CERVICAL SPINE WITHOUT CONTRAST  Technique:  Multidetector CT imaging of the head and cervical spine was performed following the standard protocol without intravenous contrast.  Multiplanar CT image reconstructions of the cervical spine were also generated.  Comparison:  None  CT HEAD  Findings: Normal ventricular morphology. No midline shift or mass effect. Normal appearance of brain parenchyma. No intracranial hemorrhage, mass lesion, or acute infarction. Visualized paranasal sinuses and mastoid air cells clear. Bones unremarkable.  IMPRESSION: No acute intracranial abnormalities.  CT CERVICAL SPINE  Findings: Disc space narrowing with endplate spur formation C5-C6, C6-C7. Visualized skull base intact. Vertebral body and disc space heights otherwise maintained without fracture or subluxation. Minimal scattered facet degenerative changes. Prevertebral soft tissues normal thickness. Tips of lung apices clear.  IMPRESSION: Degenerative disc disease changes C5-C6 and C6-C7. No acute cervical spine abnormalities.   Original Report Authenticated By: Lollie Marrow, M.D.    Dg Chest Port 1 View  01/21/2012  *RADIOLOGY REPORT*  Clinical Data: Dizziness and chest pressure.  Prior cardiac catheterization.  PORTABLE CHEST - 1 VIEW  Comparison: None.  Findings: Mild apical lordotic patient positioning. Midline trachea.  Normal heart size and mediastinal contours. No pleural effusion or pneumothorax.  No congestive failure.  Clear lungs.  IMPRESSION: No acute cardiopulmonary disease.   Original Report Authenticated By: Consuello Bossier, M.D.     DISCHARGE  EXAMINATION: Blood pressure 130/60, pulse 95, temperature 98.4 F (36.9 C), temperature source Oral, resp. rate 16, height 6\' 1"  (1.854 m), weight 100.472 kg (221 lb 8 oz), SpO2 92.00%. General appearance: alert, cooperative, appears stated age and no distress Head: Normocephalic, without obvious abnormality, atraumatic Resp: clear to auscultation bilaterally Cardio: regular rate and rhythm, S1, S2 normal, no murmur, click, rub or gallop GI: soft, non-tender; bowel sounds normal; no masses,  no organomegaly Extremities: extremities normal, atraumatic, no cyanosis or edema Neurologic: Alert and oriented x 3. No focal deficits. Strength equal. Gait was normal.  DISPOSITION: Home with parents  Discharge Orders    Future Orders Please Complete By Expires   Diet - low sodium heart healthy      Increase activity slowly      Discharge instructions      Comments:   Be sure to follow up with your doctor in 1 week. Your Cosyntropin tests results are not back yet. Stay off work for 1 week. Stay well hydrated.   Lifting restrictions      Comments:   No lifting heavy weights/objects for 2 weeks     Current Discharge Medication List    START taking these medications   Details  HYDROcodone-acetaminophen (NORCO) 10-325 MG per tablet Take 1 tablet by mouth every 6 (six) hours as needed for pain. Qty: 30 tablet, Refills: 0      CONTINUE these medications which have NOT CHANGED   Details  amphetamine-dextroamphetamine (ADDERALL) 30 MG tablet Take 30 mg by mouth daily.    aspirin EC 81 MG tablet Take 81 mg by mouth daily.    clonazePAM (KLONOPIN) 1 MG tablet Take 1 mg by mouth 4 (four) times daily as needed. For anxiety    divalproex (DEPAKOTE) 250 MG DR tablet Take 250-500 mg by mouth 2 (two) times daily. Takes 250mg  in the AM and 500mg  at night.    metoprolol tartrate (LOPRESSOR) 25 MG tablet Take 25 mg by mouth 2 (two) times daily.    QUEtiapine (SEROQUEL) 300 MG tablet Take 300 mg by  mouth at bedtime.    ranitidine (ZANTAC) 150 MG tablet Take 150 mg by mouth 2 (two) times daily.       Follow-up Information    Follow up with Dr. Lynnae Prude (PCP in [place]). Schedule an appointment as soon as possible for a visit in 1 week. (post hospitalization follow up. He will need to follow up on results of the cosyntropin stimulation test)          TOTAL DISCHARGE TIME: 35 mins  [facility]  [facility] Pager [phone]  01/23/2012, 12:48 PM  I was called by the lab in the evening of 01/23/12 after patient had been discharged stating that the Cosyntropin Stim test had not been done properly. And that it will have to be repeated. I called and left a message with the patient. I also called his PCP in [place], Dr. Mechele Collin and apprised him of the same. He will follow up with the patient to have that test repeated.  Nollie Shiflett 01/24/2012 3:59 PM

## 2012-01-25 LAB — ACTH STIMULATION, 3 TIME POINTS
Cortisol, 30 Min: 14.9 ug/dL — ABNORMAL LOW (ref >20)
Cortisol, 60 Min: 16.7 ug/dL — ABNORMAL LOW (ref >20)
Cortisol, Base: 0.7 ug/dL

## 2013-12-01 ENCOUNTER — Emergency Department (HOSPITAL_COMMUNITY): Payer: Medicare Other

## 2013-12-01 ENCOUNTER — Encounter (HOSPITAL_COMMUNITY): Payer: Self-pay | Admitting: Emergency Medicine

## 2013-12-01 ENCOUNTER — Emergency Department (HOSPITAL_COMMUNITY)
Admission: EM | Admit: 2013-12-01 | Discharge: 2013-12-01 | Disposition: A | Discharge status: Home / Self Care | Payer: Medicare Other | Attending: Emergency Medicine | Admitting: Emergency Medicine

## 2013-12-01 DIAGNOSIS — F329 Major depressive disorder, single episode, unspecified: Secondary | ICD-10-CM | POA: Insufficient documentation

## 2013-12-01 DIAGNOSIS — Z79899 Other long term (current) drug therapy: Secondary | ICD-10-CM | POA: Insufficient documentation

## 2013-12-01 DIAGNOSIS — F3289 Other specified depressive episodes: Secondary | ICD-10-CM | POA: Insufficient documentation

## 2013-12-01 DIAGNOSIS — Y9389 Activity, other specified: Secondary | ICD-10-CM | POA: Insufficient documentation

## 2013-12-01 DIAGNOSIS — X500XXA Overexertion from strenuous movement or load, initial encounter: Secondary | ICD-10-CM | POA: Insufficient documentation

## 2013-12-01 DIAGNOSIS — Z87820 Personal history of traumatic brain injury: Secondary | ICD-10-CM | POA: Insufficient documentation

## 2013-12-01 DIAGNOSIS — Z8679 Personal history of other diseases of the circulatory system: Secondary | ICD-10-CM | POA: Insufficient documentation

## 2013-12-01 DIAGNOSIS — Y9289 Other specified places as the place of occurrence of the external cause: Secondary | ICD-10-CM | POA: Insufficient documentation

## 2013-12-01 DIAGNOSIS — F17200 Nicotine dependence, unspecified, uncomplicated: Secondary | ICD-10-CM | POA: Insufficient documentation

## 2013-12-01 DIAGNOSIS — M5441 Lumbago with sciatica, right side: Secondary | ICD-10-CM

## 2013-12-01 DIAGNOSIS — IMO0002 Reserved for concepts with insufficient information to code with codable children: Secondary | ICD-10-CM | POA: Insufficient documentation

## 2013-12-01 DIAGNOSIS — Z880 Allergy status to penicillin: Secondary | ICD-10-CM | POA: Insufficient documentation

## 2013-12-01 MED ORDER — ONDANSETRON 4 MG PO TBDP
8.0000 mg | ORAL_TABLET | Freq: Once | ORAL | Status: AC
  Administered 2013-12-01: 8 mg via ORAL
  Filled 2013-12-01 (×2): qty 2

## 2013-12-01 MED ORDER — HYDROCODONE-ACETAMINOPHEN 5-325 MG PO TABS
2.0000 | ORAL_TABLET | Freq: Once | ORAL | Status: AC
  Administered 2013-12-01: 2 via ORAL
  Filled 2013-12-01: qty 2

## 2013-12-01 MED ORDER — HYDROCODONE-ACETAMINOPHEN 5-325 MG PO TABS: 1.0000 | ORAL_TABLET | Freq: Four times a day (QID) | ORAL | Status: AC | PRN

## 2013-12-01 NOTE — ED Notes (Signed)
Cleaning up a lot; when pick up cement block and strained lower back. It's been to days. Taking tylenol, bc powder, and ibuprofen and ineffective.

## 2013-12-01 NOTE — Discharge Instructions (Signed)
Back Pain, Adult Back pain is very common. The pain often gets better over time. The cause of back pain is usually not dangerous. Most people can learn to manage their back pain on their own.  HOME CARE   Stay active. Start with short walks on flat ground if you can. Try to walk farther each day.  Do not sit, drive, or stand in one place for more than 30 minutes. Do not stay in bed.  Do not avoid exercise or work. Activity can help your back heal faster.  Be careful when you bend or lift an object. Bend at your knees, keep the object close to you, and do not twist.  Sleep on a firm mattress. Lie on your side, and bend your knees. If you lie on your back, put a pillow under your knees.  Only take medicines as told by your doctor.  Put ice on the injured area.  Put ice in a plastic bag.  Place a towel between your skin and the bag.  Leave the ice on for 15-20 minutes, 03-04 times a day for the first 2 to 3 days. After that, you can switch between ice and heat packs.  Ask your doctor about back exercises or massage.  Avoid feeling anxious or stressed. Find good ways to deal with stress, such as exercise. GET HELP RIGHT AWAY IF:   Your pain does not go away with rest or medicine.  Your pain does not go away in 1 week.  You have new problems.  You do not feel well.  The pain spreads into your legs.  You cannot control when you poop (bowel movement) or pee (urinate).  Your arms or legs feel weak or lose feeling (numbness).  You feel sick to your stomach (nauseous) or throw up (vomit).  You have belly (abdominal) pain.  You feel like you may pass out (faint). MAKE SURE YOU:   Understand these instructions.  Will watch your condition.  Will get help right away if you are not doing well or get worse. Document Released: 10/26/2007 Document Revised: 08/01/2011 Document Reviewed: 09/27/2010 [facility] Patient Information 2015 [place], [place]. This information is not intended  to replace advice given to you by your health care provider. Make sure you discuss any questions you have with your health care provider.  Back Exercises Back exercises help treat and prevent back injuries. The goal is to increase your strength in your belly (abdominal) and back muscles. These exercises can also help with flexibility. Start these exercises when told by your doctor. HOME CARE Back exercises include: Pelvic Tilt.  Lie on your back with your knees bent. Tilt your pelvis until the lower part of your back is against the floor. Hold this position 5 to 10 sec. Repeat this exercise 5 to 10 times. Knee to Chest.  Pull 1 knee up against your chest and hold for 20 to 30 seconds. Repeat this with the other knee. This may be done with the other leg straight or bent, whichever feels better. Then, pull both knees up against your chest. Sit-Ups or Curl-Ups.  Bend your knees 90 degrees. Start with tilting your pelvis, and do a partial, slow sit-up. Only lift your upper half 30 to 45 degrees off the floor. Take at least 2 to 3 seonds for each sit-up. Do not do sit-ups with your knees out straight. If partial sit-ups are difficult, simply do the above but with only tightening your belly (abdominal) muscles and holding it as  told. Hip-Lift.  Lie on your back with your knees flexed 90 degrees. Push down with your feet and shoulders as you raise your hips 2 inches off the floor. Hold for 10 seconds, repeat 5 to 10 times. Back Arches.  Lie on your stomach. Prop yourself up on bent elbows. Slowly press on your hands, causing an arch in your low back. Repeat 3 to 5 times. Shoulder-Lifts.  Lie face down with arms beside your body. Keep hips and belly pressed to floor as you slowly lift your head and shoulders off the floor. Do not overdo your exercises. Be careful in the beginning. Exercises may cause you some mild back discomfort. If the pain lasts for more than 15 minutes, stop the exercises until  you see your doctor. Improvement with exercise for back problems is slow.  Document Released: 06/11/2010 Document Revised: 08/01/2011 Document Reviewed: 03/10/2011 [facility] Patient Information 2015 [place]Care, [place]. This information is not intended to replace advice given to you by your health care provider. Make sure you discuss any questions you have with your health care provider.   Emergency Department Resource Guide 1) Find a Doctor and Pay Out of Pocket Although you won't have to find out who is covered by your insurance plan, it is a good idea to ask around and get recommendations. You will then need to call the office and see if the doctor you have chosen will accept you as a new patient and what types of options they offer for patients who are self-pay. Some doctors offer discounts or will set up payment plans for their patients who do not have insurance, but you will need to ask so you aren't surprised when you get to your appointment.  2) Contact Your Local Health Department Not all health departments have doctors that can see patients for sick visits, but many do, so it is worth a call to see if yours does. If you don't know where your local health department is, you can check in your phone book. The CDC also has a tool to help you locate your state's health department, and many state websites also have listings of all of their local health departments.  3) Find a Walk-in Clinic If your illness is not likely to be very severe or complicated, you may want to try a walk in clinic. These are popping up all over the country in pharmacies, drugstores, and shopping centers. They're usually staffed by nurse practitioners or physician assistants that have been trained to treat common illnesses and complaints. They're usually fairly quick and inexpensive. However, if you have serious medical issues or chronic medical problems, these are probably not your best option.  No Primary Care Doctor: - Call  Health Connect at  [phone] - they can help you locate a primary care doctor that  accepts your insurance, provides certain services, etc. - Physician Referral Service- [phone]  Chronic Pain Problems: Organization         Address  Phone   Notes  [facility]  [phone] Patients need to be referred by their primary care doctor.   Medication Assistance: Organization         Address  Phone   Notes  [facility] Medication [facility] [address] [place], [place] [address] [phone] --Must be a resident of [facility] [place] -- Must have NO insurance coverage whatsoever (no Medicaid/ Medicare, etc.) -- The pt. MUST have a primary care doctor that directs their care  regularly and follows them in the community   [facility]ist  [phone]   [employer]  [phone]    Agencies that provide inexpensive medical care: Organization         Address  Phone   Notes  [facility]  [phone]   [facility]    [phone]   [facility] [address] [place], [place] [address] [phone]   [facility] [address], [place] [phone]   Planned Parenthood    [phone]   [facility]    [phone]   [facility]  [address], [place] Phone:  [phone], Fax:  [phone] Hours of Operation:  9 am - 6 pm, M-F.  Also accepts Medicaid/Medicare and self-pay.  [facility]  [address], [place] Phone: [phone], Fax: [phone]. Hours of Operation:  8:30 am - 5:30 pm, M-F.  Also accepts Medicaid and self-pay.  [facility] [place] [address] [place] Phone: [phone]   [facility] [address] Natasha Bence [place], [place] [phone], Ext. [phone] Mondays & Thursdays: 7-9 AM.  First 15 patients are seen on a first come, first serve  basis.    Medicaid-accepting [facility] Providers:  Organization         Address  Phone   Notes  [facility] [address], [place] [phone] Also accepts self-pay patients.  [facility] [address] Laurell Josephs [place], [place]  [phone]   [facility] [address], [place] [phone]   [facility] [address] [place] [phone]   Renaye Rakers [address], [place]   [phone] Only accepts [place] patients after they have their name applied to their card.   Self-Pay (no insurance) in [facility]:  Organization         Address  Phone   Notes  Sickle Cell Patients, [facility] [address] [place], [place] [phone]   [facility] [address] [place], [place] [phone]   [facility] [place]  [address], [place] [phone]   [facility]/Dr. Osei-Bonsu  [address] [place] or [address], [place] [phone] Phone number for both [place] and [place] locations is the same.  [facility] and [facility] [address] [place] [phone]   [facility] [address], [place] or [address] [phone]   [facility] [address] [place] [phone], phone; [phone], fax Sees patients 1st and 3rd Saturday of every month.  Must not qualify for public or private insurance (i.e. Medicaid, Medicare, [place] Health Choice, Veterans' Benefits)  Household income should be no more than 200% of the poverty level The clinic cannot treat you if you are pregnant or think you are pregnant  Sexually transmitted diseases are not treated at the clinic.    Dental Care: Organization         Address  Phone  Notes  [facility] Department of [facility] [address] [place], [place] [phone] Accepts children up to age 44  who are enrolled in Medicaid or [place] Health Choice; pregnant women with a Medicaid card; and children who have applied for Medicaid or [place] Health Choice, but were declined, whose parents can pay a reduced fee at time of service.  [facility] Department of [facility]  [address], [place] [phone] Accepts children up to age 79 who are enrolled in [place] or [place] Health Choice; pregnant women with a Medicaid card; and children who have applied for Medicaid or [place] Health Choice, but were declined, whose parents can pay a reduced fee at time of service.  [facility]  [address] [place], [place] [phone] Patients are seen by appointment only. Walk-ins are not accepted. [facility] will see patients 34 years of age and older. Monday - Tuesday (8am-5pm) Most Wednesdays (8:30-5pm) $30 per visit, cash only  [facility] Adult Dental Access PRO[facility]  [address], [facility] [phone] Patients are seen by appointment only. Walk-ins are not accepted. [facility] will see patients 66 years of age and older. One Wednesday Evening (Monthly: Volunteer Based).  $30 per visit, cash only  [employer]  [phone] for adults; Children under age 8, call Graduate Pediatric Dentistry at [phone]. Children aged 31-14, please call [phone] to request a pediatric application.  Dental services are provided in all areas of dental care including fillings, crowns and bridges, complete and partial dentures, implants, gum treatment, root canals, and extractions. Preventive care is also provided. Treatment is provided to both adults and children. Patients are selected via a lottery and there is often a waiting list.   [facility] [address] [place]  [phone]  [email or web address]   [facility] [address] [place], [place] [phone], Ext. [phone] Second and Fourth Thursday of each month, opens at 6:30 AM; Clinic ends at 9 AM.  Patients are seen on a first-come first-served basis, and a limited number are seen during each clinic.   [facility]  [address] Ether Griffins [place], [place] [phone]   Eligibility Requirements You must have lived in [place], [place], or [place] counties for at least the last three months.   You cannot be eligible for state or federal sponsored [place], including [employer], [place], or [employer].   You generally cannot be eligible for healthcare insurance through your employer.    How to apply: Eligibility screenings are held every Tuesday and Wednesday afternoon from 1:00 pm until 4:00 pm. You do not need an appointment for the interview!  [facility] [address] [place], [place] [phone]   [facility]  [phone]   [facility]  [phone]   [facility]  [phone]    Behavioral Health Resources in the Community: Intensive Outpatient Programs Organization         Address  Phone  Notes  [facility] Services [address] [place], [place] [phone]   [facility] [address] [place], [place] [phone]   [facility]: [facility] Drug [facility] [address], [place], [place]  [phone]   [facility] [address]  [place], [place] [phone] or [phone]   Substance Abuse Resources Organization         Address  Phone  Notes  [facility]  [phone]   [facility]  [phone]  [facility]  [phone]   [facility]  [phone]   Residential & Outpatient Substance Abuse Program  [phone]   Psychological Services Organization          Address  Phone  Notes  [facility]  [phone]- [phone]   [facility]  [phone]- [phone]   [facility] [address], [place] [phone] or [phone]    Mobile Crisis Teams Organization         Address  Phone  Notes  [facility], Mobile Crisis Care Unit  [phone]   [facility]  [address] [place], [place] [phone]   Doristine LocksSharon DeEsch [address] [place] [phone]    Self-Help/Support Groups Organization         Address  Phone             Notes  [facility] [place] - variety of support groups  [phone] Call for more information  Narcotics Anonymous (NA), [facility] [address], [place]  2 meetings at this location   Statisticianesidential Treatment Programs Organization         Address  Phone  Notes  [facility] [address],    [place]  [phone]   [facility]  [address], [place] [phone], [place], [place] [phone]   [facility] [address], [place] [phone] Admissions: 8am-3pm M-F  [facility] [address]    [place], [place] [phone]   [facility] [address], [place], [place] [phone]   [facility] [address]  [place], [place] [phone]   [facility] - Intensive Outpatient [address], [place], [place] [phone]   [facility] ([facility] [address]  [place], [place] [phone] or [phone]   [facility] (RT[facility]) [address] [place], [place] [phone] Accepts Medicaid  [facility] [address] [place] [address]  [place] [phone] Substance Abuse/Addiction Treatment   [facility] Behavioral Health Resources Organization         Address  Phone  Notes  [facility]  [phone]   Angie FavaJulie Brannon, PhD [address] [place], [place]   [phone](336) [phone] or [phone]) [phone]   [facility]   [address] [place], [place] [phone](336) [phone]   [facility] [address], [place], [place] [phone] Insurance/Medicaid/sponsorship through [facility]  [facility] [address]                                    [place], [place] [phone]) [phone] Therapy/tele-psych/case  [facility] [address].   [place], [place] [phone]) [phone]    Dr. Lolly MustacheArfeen  [phone]   [facility]  [employer] [facility] 1) [address], [place] 2) [address], [place] 3)  [address], [place] [phone]  [phone](336) [phone]   [facility] Child Abuse Hotline [phone]) 8287128862 or [phone](336) [phone] (After Hours)

## 2013-12-01 NOTE — ED Provider Notes (Signed)
Medical screening examination/treatment/procedure(s) were performed by non-physician practitioner and as supervising physician I was immediately available for consultation/collaboration.   EKG Interpretation None        Joya Gaskinsonald W Rahiem Schellinger, MD 12/01/13 [phone]

## 2013-12-01 NOTE — ED Provider Notes (Signed)
CSN: [id number]     Arrival date & time 12/01/13  0150 History   First MD Initiated Contact with Patient 12/01/13 0457     Chief Complaint  Patient presents with  . Back Pain     (Consider location/radiation/quality/duration/timing/severity/associated sxs/prior Treatment) HPI Comments: Patient is a 48 year old male with history of arrhythmia, mental disorder, traumatic brain injury, depression who presents today with back pain. He reports that 2 days ago he was picking up a cement block when he strained his lower back and hit himself in the head with cement. There was no loss of consciousness, but the patient has an abrasion to his forehead. He does not have a headache, dizziness, blurry vision, double vision, visual disturbance. The patient reports it is a throbbing, aching pain to his back. The pain radiates into his right leg. He has been taking Tylenol, BC powder, ibuprofen without relief of his symptoms. He denies any bowel or bladder incontinence, IV drug abuse, history of cancer. He states that normally he takes pain medicine, but not been able to find a doctor here. He 3 weeks ago and has not had any pain medicine since that time. Tdap is UTD.   The history is provided by the patient. No language interpreter was used.    Past Medical History  Diagnosis Date  . Arrhythmia   . Mental disorder   . Depression   . H/O prior ablation treatment   . TBI (traumatic brain injury)    Past Surgical History  Procedure Laterality Date  . Radiofrequency ablation    . Neck surgery      on salivary glands and lymph nodes for unknown reason   No family history on file. History  Substance Use Topics  . Smoking status: Current Every Day Smoker -- 1.50 packs/day for 28 years    Types: Cigarettes  . Smokeless tobacco: Not on file  . Alcohol Use: No    Review of Systems  Constitutional: Negative for fever.  Respiratory: Negative for shortness of breath.   Cardiovascular: Negative for chest  pain.  Gastrointestinal: Negative for nausea, vomiting and abdominal pain.  Musculoskeletal: Positive for back pain and myalgias.  Skin: Positive for wound.  All other systems reviewed and are negative.     Allergies  Ketorolac; Other; Penicillins; and Seroquel  Home Medications   Prior to Admission medications   Medication Sig Start Date End Date Taking? Authorizing Provider  amphetamine-dextroamphetamine (ADDERALL) 30 MG tablet Take 30 mg by mouth daily.   Yes Historical Provider, MD  clonazePAM (KLONOPIN) 1 MG tablet Take 1 mg by mouth 4 (four) times daily as needed. For anxiety   Yes Historical Provider, MD  cloNIDine (CATAPRES) 0.1 MG tablet Take 0.1 mg by mouth 4 (four) times daily.   Yes Historical Provider, MD  divalproex (DEPAKOTE) 250 MG DR tablet Take 250-500 mg by mouth 2 (two) times daily. Takes 250mg  in the AM and 500mg  at night.   Yes Historical Provider, MD  ranitidine (ZANTAC) 150 MG tablet Take 150 mg by mouth 2 (two) times daily.   Yes Historical Provider, MD   BP 125/82  Pulse 85  Temp(Src) 98.3 F (36.8 C) (Oral)  Resp 16  SpO2 98% Physical Exam  Nursing note and vitals reviewed. Constitutional: He is oriented to person, place, and time. He appears well-developed and well-nourished. No distress.  HENT:  Head: Normocephalic. Head is with abrasion.    Right Ear: External ear normal.  Left Ear: External ear normal.  Nose: Nose normal.  Eyes: Conjunctivae are normal.  Neck: Normal range of motion. No tracheal deviation present.  Cardiovascular: Normal rate, regular rhythm, normal heart sounds, intact distal pulses and normal pulses.   Pulses:      Radial pulses are 2+ on the right side, and 2+ on the left side.       Posterior tibial pulses are 2+ on the right side, and 2+ on the left side.  Pulmonary/Chest: Effort normal and breath sounds normal. No stridor.  Abdominal: Soft. He exhibits no distension. There is no tenderness.  Musculoskeletal: Normal  range of motion.       Back:  Tenderness to palpation of her lumbar spine. No deformities or step-offs. Straight-leg raise positive on the right Strength 5 out of 5 in all extremities.  Neurological: He is alert and oriented to person, place, and time.  Skin: Skin is warm and dry. He is not diaphoretic.  Psychiatric: He has a normal mood and affect. His behavior is normal.    ED Course  Procedures (including critical care time) Labs Review Labs Reviewed - No data to display  Imaging Review Dg Lumbar Spine Complete  12/01/2013   CLINICAL DATA:  Fall, low back pain.  EXAM: LUMBAR SPINE - COMPLETE 4+ VIEW  COMPARISON:  Lumbar spine radiographs January 21, 2012  FINDINGS: There is no evidence of lumbar spine fracture. Transitional anatomy, lumbarized S1 vertebra Alignment is normal. Mild ventral endplate spurring Z6-11-2 thru L4-5, increased from prior examination. Intervertebral disc spaces are maintained.Mild lower lumbar facet arthropathy. No pars interarticularis defects.  Bilateral sacroiliac osteoarthrosis. 3 mm calculus projects in left upper quadrant.  IMPRESSION: Mildly progressed lumbar spondylosis without acute fracture or malalignment.  3 mm calculus in left upper quadrant may reflect nephrolithiasis.   Electronically Signed   By: Awilda Metroourtnay  Bloomer   On: 12/01/2013 05:48     EKG Interpretation None      MDM   Final diagnoses:  Midline low back pain with right-sided sciatica    Patient with back pain.  No neurological deficits and normal neuro exam.  Patient can walk but states is painful.  No loss of bowel or bladder control.  No concern for cauda equina.  No fever, night sweats, weight loss, h/o cancer, IVDU.  RICE protocol and pain medicine indicated and discussed with patient.     Mora BellmanHannah S Jerad Dunlap, PA-C 12/01/13 [id number]

## 2016-01-07 IMAGING — CR DG LUMBAR SPINE COMPLETE 4+V
5 series · 5 of 5 positions shown · non-contrast
Comparison: Lumbar spine radiographs January 21, 2012

CLINICAL DATA: Fall, low back pain.

EXAM:
LUMBAR SPINE - COMPLETE 4+ VIEW

[t lumbar spine ap]
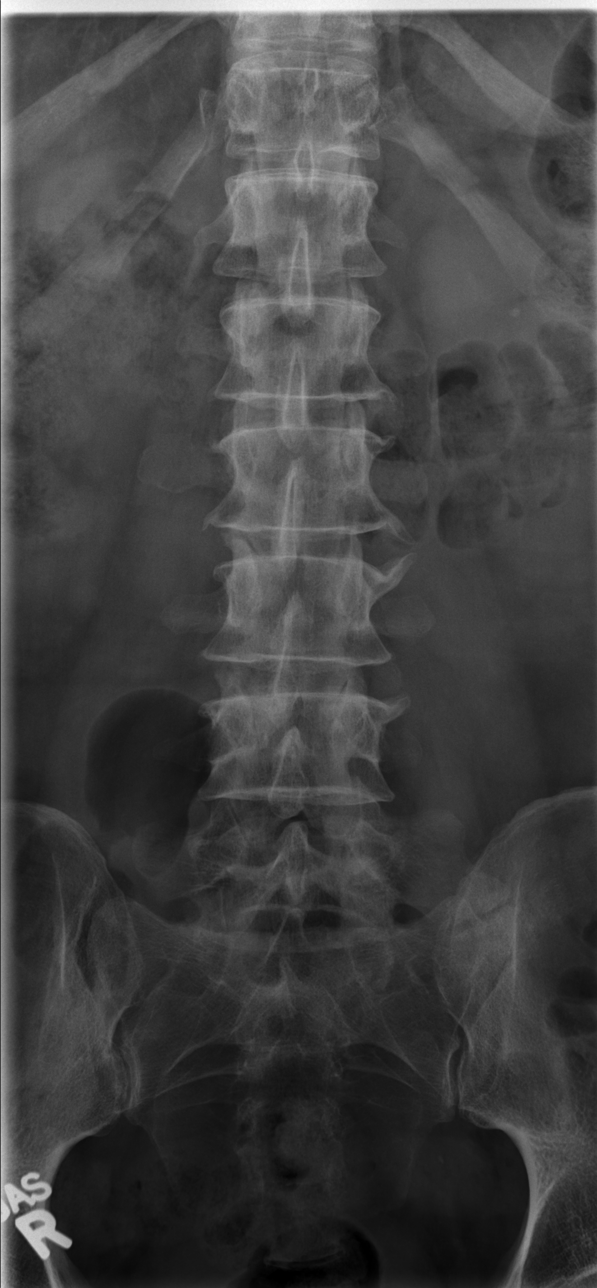

[t lumbar spine obl (1 of 2)]
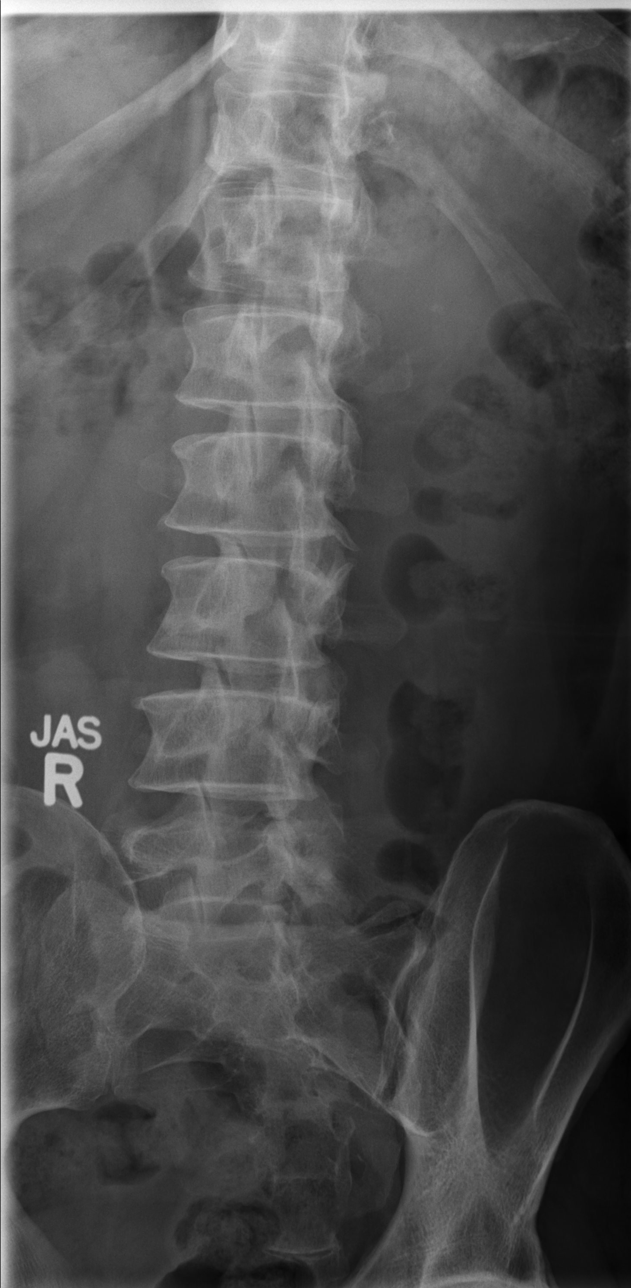

[t lumbar spine obl (2 of 2)]
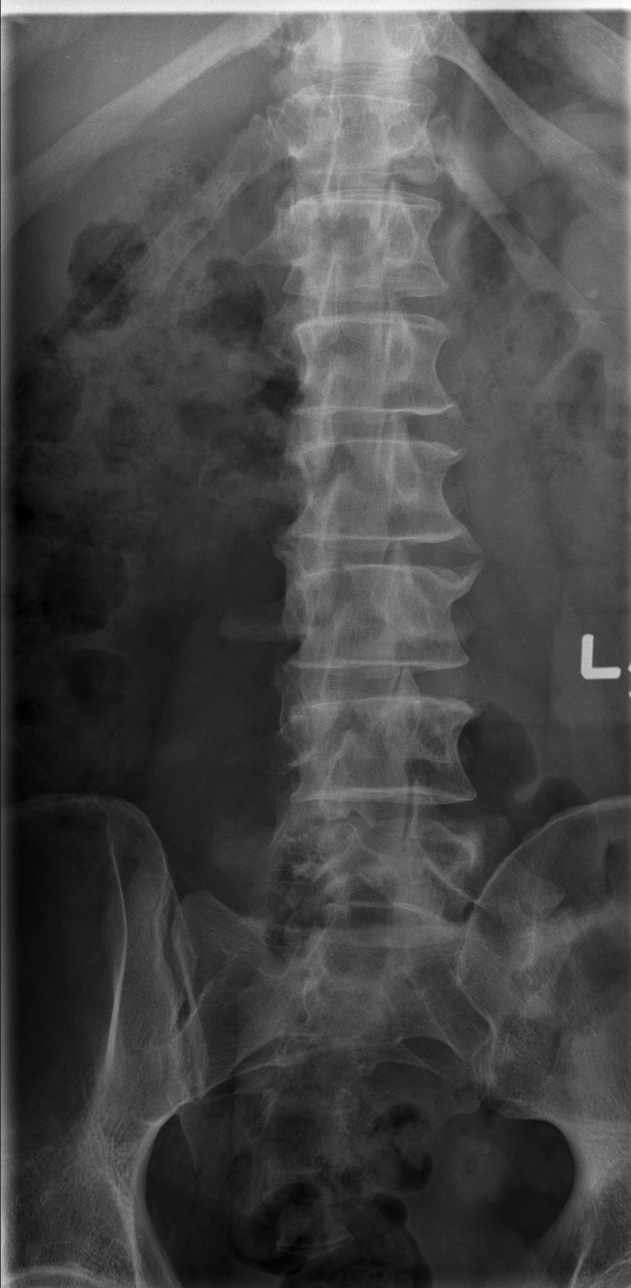

[t lumbar spine lat]
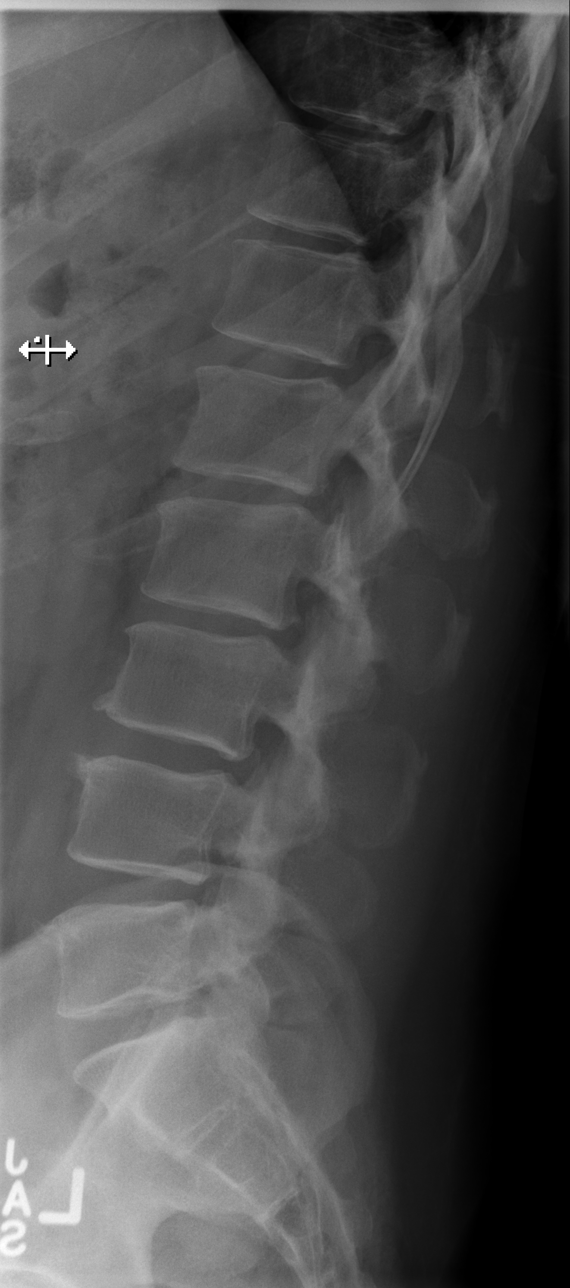

[t lumbar l-5 s-1 spot]
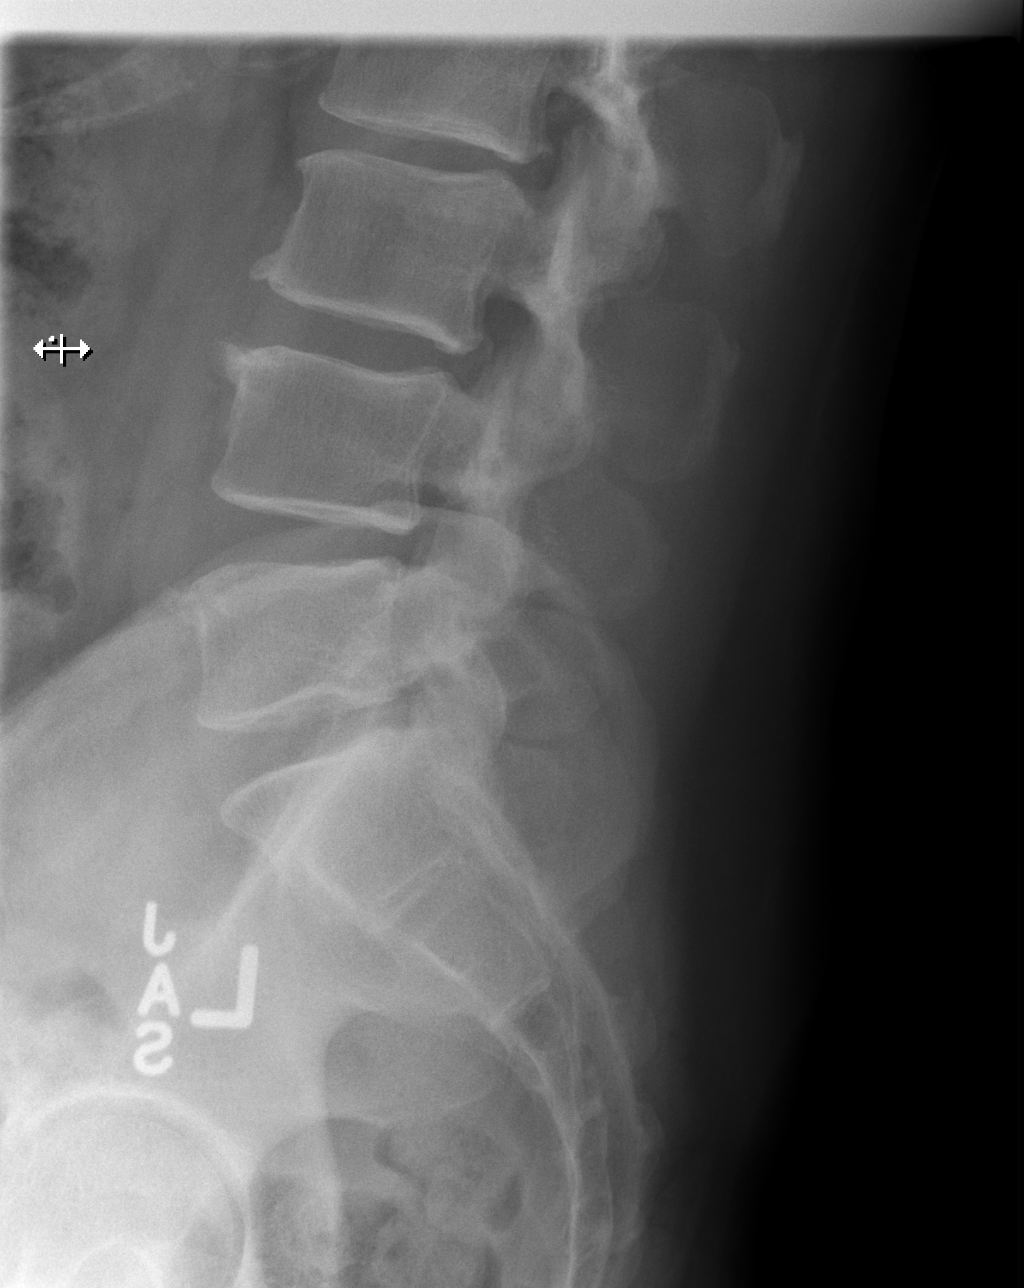

[5 of 5 positions shown; findings below may reference images not displayed]

FINDINGS: There is no evidence of lumbar spine fracture. Transitional anatomy,
lumbarized S1 vertebra Alignment is normal. Mild ventral endplate
spurring L1-2 thru L4-5, increased from prior examination.
Intervertebral disc spaces are maintained.Mild lower lumbar facet
arthropathy. No pars interarticularis defects.

Bilateral sacroiliac osteoarthrosis. 3 mm calculus projects in left
upper quadrant.
IMPRESSION: Mildly progressed lumbar spondylosis without acute fracture or
malalignment.

3 mm calculus in left upper quadrant may reflect nephrolithiasis.

  By: Tussy Na Tiziua Rongwane

## 2017-09-06 ENCOUNTER — Emergency Department (HOSPITAL_COMMUNITY)
Admission: EM | Admit: 2017-09-06 | Discharge: 2017-09-06 | Disposition: A | Discharge status: Home / Self Care | Payer: Medicare Other | Attending: Emergency Medicine | Admitting: Emergency Medicine

## 2017-09-06 ENCOUNTER — Encounter (HOSPITAL_COMMUNITY): Payer: Self-pay

## 2017-09-06 DIAGNOSIS — G8929 Other chronic pain: Secondary | ICD-10-CM | POA: Diagnosis not present

## 2017-09-06 DIAGNOSIS — M25561 Pain in right knee: Secondary | ICD-10-CM | POA: Diagnosis not present

## 2017-09-06 DIAGNOSIS — Z87820 Personal history of traumatic brain injury: Secondary | ICD-10-CM | POA: Diagnosis not present

## 2017-09-06 DIAGNOSIS — F17210 Nicotine dependence, cigarettes, uncomplicated: Secondary | ICD-10-CM | POA: Diagnosis not present

## 2017-09-06 DIAGNOSIS — M25562 Pain in left knee: Secondary | ICD-10-CM | POA: Diagnosis not present

## 2017-09-06 DIAGNOSIS — Z79899 Other long term (current) drug therapy: Secondary | ICD-10-CM | POA: Diagnosis not present

## 2017-09-06 DIAGNOSIS — B356 Tinea cruris: Secondary | ICD-10-CM | POA: Diagnosis not present

## 2017-09-06 LAB — CBG MONITORING, ED: Glucose-Capillary: 98 mg/dL (ref 65–99)

## 2017-09-06 MED ORDER — DICLOFENAC SODIUM 1 % TD GEL: 2.0000 g | Freq: Four times a day (QID) | TRANSDERMAL | 0 refills | Status: AC

## 2017-09-06 MED ORDER — TRAMADOL HCL 50 MG PO TABS
50.0000 mg | ORAL_TABLET | Freq: Once | ORAL | Status: AC
  Administered 2017-09-06: 50 mg via ORAL
  Filled 2017-09-06: qty 1

## 2017-09-06 MED ORDER — CLOTRIMAZOLE 1 % EX CREA: TOPICAL_CREAM | CUTANEOUS | 0 refills | Status: AC

## 2017-09-06 NOTE — ED Provider Notes (Signed)
[facility]-EMERGENCY DEPT Provider Note   CSN: [id number] Arrival date & time: 09/06/17  0014     History   Chief Complaint Chief Complaint  Patient presents with  . Leg Pain    HPI Dennis Fields is a 52 y.o. male presenting for evaluation of bilateral knee pain and genital rash.  Patient states he has had bilateral knee pain for the past several months.  It is worse when he first wakes up in the morning.  Pain is described as being by his kneecaps and shooting down his legs.  He has been trying BC powders and Advil without improvement of symptoms.  He denies fall, trauma, or injury.  He states he has been told he has arthritis before, but is not on anything for this.  He denies numbness or tingling.  No pain above his knees. Additionally, patient presenting for evaluation of genital rash.  This is also been going on for several months.  He states it is itchy and moist.  No penile drainage, swelling, or testicular swelling.  He is not sexually active, and has no concern for STDs.  He denies fevers, chills, abdominal pain, nausea, or vomiting.  HPI  Past Medical History:  Diagnosis Date  . Arrhythmia   . Depression   . H/O prior ablation treatment   . Mental disorder   . TBI (traumatic brain injury) [facility](HCC)     Patient Active Problem List   Diagnosis Date Noted  . ARF (acute renal failure) (HCC) 01/21/2012  . Neck pain 01/21/2012  . Back pain 01/21/2012  . Hypotension 01/21/2012  . History of prior ablation treatment 01/21/2012  . History of colon polyps 01/21/2012  . Rhabdomyolysis 01/21/2012    Past Surgical History:  Procedure Laterality Date  . NECK SURGERY     on salivary glands and lymph nodes for unknown reason  . RADIOFREQUENCY ABLATION          Home Medications    Prior to Admission medications   Medication Sig Start Date End Date Taking? Authorizing Provider  amphetamine-dextroamphetamine (ADDERALL) 30 MG tablet Take 30 mg by mouth  daily.    [provider]  clonazePAM (KLONOPIN) 1 MG tablet Take 1 mg by mouth 4 (four) times daily as needed. For anxiety    [provider]  cloNIDine (CATAPRES) 0.1 MG tablet Take 0.1 mg by mouth 4 (four) times daily.    [provider]  clotrimazole (LOTRIMIN) 1 % cream Apply to affected area 2 times daily for 4 weeks or until symptoms resolve 09/06/17   Iliya Spivack, PA-C  diclofenac sodium (VOLTAREN) 1 % GEL Apply 2 g topically 4 (four) times daily. 09/06/17   Diarra Ceja, PA-C  divalproex (DEPAKOTE) 250 MG DR tablet Take 250-500 mg by mouth 2 (two) times daily. Takes 250mg  in the AM and 500mg  at night.    [provider]  HYDROcodone-acetaminophen (NORCO/VICODIN) 5-325 MG per tablet Take 1-2 tablets by mouth every 6 (six) hours as needed for moderate pain or severe pain. 12/01/13   Junious SilkMerrell, Hannah, PA-C  ranitidine (ZANTAC) 150 MG tablet Take 150 mg by mouth 2 (two) times daily.    [provider]    Family History History reviewed. No pertinent family history.  Social History Social History   Tobacco Use  . Smoking status: Current Every Day Smoker    Packs/day: 1.50    Years: 28.00    Pack years: 42.00    Types: Cigarettes  . Smokeless  tobacco: Never Used  Substance Use Topics  . Alcohol use: No  . Drug use: No     Allergies   Ketorolac; Other; Penicillins; and Seroquel [quetiapine]   Review of Systems Review of Systems  Constitutional: Negative for chills and fever.  Musculoskeletal: Positive for arthralgias.  Skin: Positive for rash.     Physical Exam Updated Vital Signs BP 107/79   Pulse 64   Temp 98.1 F (36.7 C) (Oral)   Resp 18   SpO2 94%   Physical Exam  Constitutional: He is oriented to person, place, and time. He appears well-developed and well-nourished. No distress.  HENT:  Head: Normocephalic and atraumatic.  Eyes: EOM are normal.  Neck: Normal range of motion.  Cardiovascular: Normal  rate, regular rhythm and intact distal pulses.  Pulmonary/Chest: Effort normal and breath sounds normal. No respiratory distress. He has no wheezes.  Abdominal: Soft. He exhibits no distension. There is no tenderness.  Genitourinary:  Genitourinary Comments: Chaperone present.  Erythematous and moist rash noted in the groin, consistent with tinea.  No purulent drainage.  Musculoskeletal: Normal range of motion. He exhibits tenderness. He exhibits no edema or deformity.  No obvious swelling, warmth, or deformity of the knees.  Mild tenderness to palpation of anterior knees bilaterally.  Sensation intact bilaterally.  Color and warmth equal bilaterally.  Soft compartments.  No tenderness to palpation of calves.  Pedal pulses intact bilaterally.  Patient is ambulatory.  Neurological: He is alert and oriented to person, place, and time. No sensory deficit.  Skin: Skin is warm. No rash noted.  Psychiatric: He has a normal mood and affect.  Nursing note and vitals reviewed.    ED Treatments / Results  Labs (all labs ordered are listed, but only abnormal results are displayed) Labs Reviewed  CBG MONITORING, ED    EKG None  Radiology No results found.  Procedures Procedures (including critical care time)  Medications Ordered in ED Medications  traMADol (ULTRAM) tablet 50 mg (50 mg Oral Given 09/06/17 0659)     Initial Impression / Assessment and Plan / ED Course  I have reviewed the triage vital signs and the nursing notes.  Pertinent labs & imaging results that were available during my care of the patient were reviewed by me and considered in my medical decision making (see chart for details).     Patient presenting for evaluation of knee pain and general rash.  Physical exam reassuring, patient is neurovascularly intact.  He appears nontoxic.  Knee pain history and physical consistent with arthritis.  I do not believe x-rays would be beneficial at this time.  Doubt septic joint,  DVT, fracture, or infection.  Additionally, patient with genital rash.  Exam consistent with tinea.  Case discussed with attending, Dr. Read Drivers evaluated the patient.  Will discharge with Lotrimin cream and Voltaren gel.  Patient encouraged to establish care with primary care.  At this time, patient appears safe for discharge.  Return precautions given.  Patient states he understands and agrees to plan.   Final Clinical Impressions(s) / ED Diagnoses   Final diagnoses:  Tinea cruris  Chronic pain of both knees    ED Discharge Orders        Ordered    clotrimazole (LOTRIMIN) 1 % cream     09/06/17 0648    diclofenac sodium (VOLTAREN) 1 % GEL  4 times daily     09/06/17 0648       Alveria Apley, PA-C 09/06/17 0732  Paula Libra, MD 09/06/17 2229

## 2017-09-06 NOTE — ED Notes (Signed)
Bed: WTR7 Expected date:  Expected time:  Means of arrival:  Comments: 

## 2017-09-06 NOTE — Discharge Instructions (Addendum)
Use Lotrimin cream twice a day on your genital rash. Use Voltaren gel up to 4 times a day as needed for knee pain. It is important that you establish primary care.  Follow-up with [facility] we may contact the [phone] number in the back of this paperwork to help you establish primary care. Return to the emergency room if you develop numbness, inability to walk, redness and warmth of your knees, or any new or concerning symptoms.

## 2017-09-06 NOTE — ED Triage Notes (Signed)
Pt complains of bilateral leg pain for over a week and states he has a bad rash on his genitals that he's had for awhile
